# Patient Record
Sex: Female | Born: 1975 | Race: Black or African American | Hispanic: No | Smoking: Never smoker
Health system: Southern US, Community
[De-identification: ages and names within clinical notes are randomized; demographics above are authoritative.]

## PROBLEM LIST (undated history)

## (undated) DIAGNOSIS — E119 Type 2 diabetes mellitus without complications: Secondary | ICD-10-CM

---

## 2017-03-12 ENCOUNTER — Emergency Department (HOSPITAL_COMMUNITY)
Admission: EM | Admit: 2017-03-12 | Discharge: 2017-03-12 | Disposition: A | Discharge status: Home / Self Care | Payer: Medicaid Other | Attending: Emergency Medicine | Admitting: Emergency Medicine

## 2017-03-12 ENCOUNTER — Encounter (HOSPITAL_COMMUNITY): Payer: Self-pay | Admitting: Emergency Medicine

## 2017-03-12 DIAGNOSIS — M549 Dorsalgia, unspecified: Secondary | ICD-10-CM | POA: Diagnosis not present

## 2017-03-12 DIAGNOSIS — G8929 Other chronic pain: Secondary | ICD-10-CM | POA: Insufficient documentation

## 2017-03-12 DIAGNOSIS — M545 Low back pain: Secondary | ICD-10-CM | POA: Diagnosis present

## 2017-03-12 LAB — COMPREHENSIVE METABOLIC PANEL
ALT: 48 U/L (ref 14–54)
AST: 34 U/L (ref 15–41)
Albumin: 4 g/dL (ref 3.5–5.0)
Alkaline Phosphatase: 56 U/L (ref 38–126)
Anion gap: 8 (ref 5–15)
BUN: 7 mg/dL (ref 6–20)
CHLORIDE: 102 mmol/L (ref 101–111)
CO2: 25 mmol/L (ref 22–32)
Calcium: 8.9 mg/dL (ref 8.9–10.3)
Creatinine, Ser: 0.61 mg/dL (ref 0.44–1.00)
Glucose, Bld: 105 mg/dL — ABNORMAL HIGH (ref 65–99)
POTASSIUM: 3.6 mmol/L (ref 3.5–5.1)
SODIUM: 135 mmol/L (ref 135–145)
Total Bilirubin: 0.6 mg/dL (ref 0.3–1.2)
Total Protein: 7.9 g/dL (ref 6.5–8.1)

## 2017-03-12 LAB — URINALYSIS, ROUTINE W REFLEX MICROSCOPIC
Bilirubin Urine: NEGATIVE
Glucose, UA: NEGATIVE mg/dL
Hgb urine dipstick: NEGATIVE
KETONES UR: NEGATIVE mg/dL
LEUKOCYTES UA: NEGATIVE
Nitrite: NEGATIVE
PROTEIN: 30 mg/dL — AB
Specific Gravity, Urine: 1.011 (ref 1.005–1.030)
pH: 7 (ref 5.0–8.0)

## 2017-03-12 LAB — CBC
HEMATOCRIT: 38.5 % (ref 36.0–46.0)
Hemoglobin: 12.8 g/dL (ref 12.0–15.0)
MCH: 28 pg (ref 26.0–34.0)
MCHC: 33.2 g/dL (ref 30.0–36.0)
MCV: 84.2 fL (ref 78.0–100.0)
Platelets: 257 10*3/uL (ref 150–400)
RBC: 4.57 MIL/uL (ref 3.87–5.11)
RDW: 13.5 % (ref 11.5–15.5)
WBC: 5.7 10*3/uL (ref 4.0–10.5)

## 2017-03-12 LAB — LIPASE, BLOOD: LIPASE: 31 U/L (ref 11–51)

## 2017-03-12 MED ORDER — CYCLOBENZAPRINE HCL 10 MG PO TABS: 10.0000 mg | ORAL_TABLET | Freq: Two times a day (BID) | ORAL | 0 refills | Status: DC | PRN

## 2017-03-12 MED ORDER — PREDNISONE 20 MG PO TABS: ORAL_TABLET | ORAL | 0 refills | Status: DC

## 2017-03-12 MED ORDER — IBUPROFEN 600 MG PO TABS: 600.0000 mg | ORAL_TABLET | Freq: Four times a day (QID) | ORAL | 0 refills | Status: DC | PRN

## 2017-03-12 NOTE — ED Notes (Signed)
PA at bedside.

## 2017-03-12 NOTE — ED Provider Notes (Signed)
[facility] DEPT Provider Note   CSN: [id number] Arrival date & time: 03/12/17  1519     History   Chief Complaint Chief Complaint  Patient presents with  . Abdominal Pain  . Back Pain    HPI Raven Porter is a 41 y.o. female.  HPI   41 year old female without any significant past medical history presenting for evaluation of back pain. Patient mentioned that she has had recurrent lower back pain ongoing for many years she has been seen by multiple specialists for her back pain and states nothing was wrong. She recently moved from [place] down here for the past several months. She still continues to having the same back pain and decided to come here for further evaluation. Pain has become aggressively worse the past 3 or 4 days. Pain is described as a pressure and sharp sensation that wraps around her abdomen sometimes radiates down her leg. Pain worse with ambulation and with bending forward. She works for housekeeping. She denies any associated fever, chills, URI symptoms, chest pain, trouble breathing,  vomiting diarrhea, dysuria, or hematuria. Denies any bowel bladder incontinence or saddle anesthesia. She has been taking gabapentin at home without adequate relief. She denies any recent strenuous activities or heavy lifting or any recent injury.  History reviewed. No pertinent past medical history.  There are no active problems to display for this patient.   History reviewed. No pertinent surgical history.  OB History    No data available       Home Medications    Prior to Admission medications   Not on File    Family History No family history on file.  Social History Social History  Substance Use Topics  . Smoking status: Never Smoker  . Smokeless tobacco: Never Used  . Alcohol use No     Allergies   Patient has no allergy information on record.   Review of Systems Review of Systems  All other systems reviewed and are negative.    Physical  Exam Updated Vital Signs BP (!) 139/103 (BP Location: Right Arm)   Pulse (!) 55   Temp 98.8 F (37.1 C) (Oral)   Resp 17   LMP  (LMP Unknown)   SpO2 96%   Physical Exam  Constitutional: She appears well-developed and well-nourished. No distress.  Moderately obese, ambulate without difficulty, in no acute discomfort.  HENT:  Head: Atraumatic.  Eyes: Conjunctivae are normal.  Neck: Neck supple.  Cardiovascular: Normal rate, regular rhythm and intact distal pulses.   Pulmonary/Chest: Effort normal and breath sounds normal.  Abdominal: Soft. Bowel sounds are normal. She exhibits no distension. There is no tenderness.  Musculoskeletal: She exhibits tenderness (Tenderness to lumbar spine and paraspinal muscle on palpation without crepitus or step-off. Normal range of motion throughout lower back. Negative straight leg raise.).  Neurological: She is alert.  5 out 5 strength to bilateral lower extremities with intact distal pulses. Patellar deep tendon reflex intact bilaterally.  Skin: No rash noted.  Psychiatric: She has a normal mood and affect.  Nursing note and vitals reviewed.    ED Treatments / Results  Labs (all labs ordered are listed, but only abnormal results are displayed) Labs Reviewed  COMPREHENSIVE METABOLIC PANEL - Abnormal; Notable for the following:       Result Value   Glucose, Bld 105 (*)    All other components within normal limits  URINALYSIS, ROUTINE W REFLEX MICROSCOPIC - Abnormal; Notable for the following:    Protein, ur 30 (*)  Bacteria, UA RARE (*)    Squamous Epithelial / LPF 0-5 (*)    All other components within normal limits  LIPASE, BLOOD  CBC    EKG  EKG Interpretation None       Radiology No results found.  Procedures Procedures (including critical care time)  Medications Ordered in ED Medications - No data to display   Initial Impression / Assessment and Plan / ED Course  I have reviewed the triage vital signs and the  nursing notes.  Pertinent labs & imaging results that were available during my care of the patient were reviewed by me and considered in my medical decision making (see chart for details).    BP (!) 139/103 (BP Location: Right Arm)   Pulse (!) 55   Temp 98.8 F (37.1 C) (Oral)   Resp 17   LMP  (LMP Unknown)   SpO2 96%    Final Clinical Impressions(s) / ED Diagnoses   Final diagnoses:  Other chronic back pain    New Prescriptions New Prescriptions   CYCLOBENZAPRINE (FLEXERIL) 10 MG TABLET    Take 1 tablet (10 mg total) by mouth 2 (two) times daily as needed for muscle spasms.   IBUPROFEN (ADVIL,MOTRIN) 600 MG TABLET    Take 1 tablet (600 mg total) by mouth every 6 (six) hours as needed.   PREDNISONE (DELTASONE) 20 MG TABLET    2 tabs po daily x 4 days   7:43 PM Patient here with acute on chronic low back pain. Report occasional radicular pain. No red flags such as no history of active cancer or IV drug use. She is able to ambulate without difficulty. She does not have any reproducible abdominal pain on exam. Today her labs are reassuring, normal lipase, normal electrolytes panel, normal WBC, no anemia, urine shows no signs of urinary tract infection. No blood in her urine to suggest kidney stone. No cauda equina symptoms.  Given her moderate body habitus I suspect pain is likely muscular skeletal, possibly sciatica.  Given the chronicity of her sxs, pt d/c home with sxs treatment and outpt ortho referral as needed.  Return precaution given.    Domenic Moras, PA-C 03/12/17 2023    Charlesetta Shanks, MD 03/12/17 2322

## 2017-03-12 NOTE — ED Triage Notes (Signed)
Pt to ER for evaluation of lower back pain with abdominal pain x3-4 days with constant nausea without vomiting or diarrhea. Denies urinary symptoms. A/o x4.

## 2017-03-12 NOTE — ED Notes (Signed)
Patient able to ambulate independently  

## 2018-01-19 ENCOUNTER — Encounter (HOSPITAL_COMMUNITY): Payer: Self-pay | Admitting: *Deleted

## 2018-01-19 ENCOUNTER — Emergency Department (HOSPITAL_COMMUNITY)
Admission: EM | Admit: 2018-01-19 | Discharge: 2018-01-19 | Disposition: A | Discharge status: Home / Self Care | Payer: Medicaid Other | Attending: Emergency Medicine | Admitting: Emergency Medicine

## 2018-01-19 DIAGNOSIS — Z79899 Other long term (current) drug therapy: Secondary | ICD-10-CM | POA: Diagnosis not present

## 2018-01-19 DIAGNOSIS — R1084 Generalized abdominal pain: Secondary | ICD-10-CM | POA: Insufficient documentation

## 2018-01-19 DIAGNOSIS — R110 Nausea: Secondary | ICD-10-CM | POA: Diagnosis not present

## 2018-01-19 LAB — COMPREHENSIVE METABOLIC PANEL
ALBUMIN: 3.5 g/dL (ref 3.5–5.0)
ALK PHOS: 42 U/L (ref 38–126)
ALT: 20 U/L (ref 0–44)
AST: 26 U/L (ref 15–41)
Anion gap: 10 (ref 5–15)
BILIRUBIN TOTAL: 0.7 mg/dL (ref 0.3–1.2)
BUN: 11 mg/dL (ref 6–20)
CALCIUM: 8.7 mg/dL — AB (ref 8.9–10.3)
CO2: 25 mmol/L (ref 22–32)
Chloride: 102 mmol/L (ref 98–111)
Creatinine, Ser: 0.67 mg/dL (ref 0.44–1.00)
GFR calc Af Amer: >60 mL/min (ref >60)
GFR calc non Af Amer: >60 mL/min (ref >60)
GLUCOSE: 177 mg/dL — AB (ref 70–99)
Potassium: 3.6 mmol/L (ref 3.5–5.1)
Sodium: 137 mmol/L (ref 135–145)
TOTAL PROTEIN: 6.7 g/dL (ref 6.5–8.1)

## 2018-01-19 LAB — I-STAT BETA HCG BLOOD, ED (MC, WL, AP ONLY): I-stat hCG, quantitative: <5 m[IU]/mL (ref <5)

## 2018-01-19 LAB — URINALYSIS, ROUTINE W REFLEX MICROSCOPIC
BILIRUBIN URINE: NEGATIVE
GLUCOSE, UA: NEGATIVE mg/dL
Hgb urine dipstick: NEGATIVE
Ketones, ur: NEGATIVE mg/dL
NITRITE: NEGATIVE
Protein, ur: NEGATIVE mg/dL
SPECIFIC GRAVITY, URINE: 1.012 (ref 1.005–1.030)
pH: 6 (ref 5.0–8.0)

## 2018-01-19 LAB — CBC
HCT: 39.8 % (ref 36.0–46.0)
Hemoglobin: 13.4 g/dL (ref 12.0–15.0)
MCH: 29.3 pg (ref 26.0–34.0)
MCHC: 33.7 g/dL (ref 30.0–36.0)
MCV: 87.1 fL (ref 78.0–100.0)
Platelets: 280 10*3/uL (ref 150–400)
RBC: 4.57 MIL/uL (ref 3.87–5.11)
RDW: 11.9 % (ref 11.5–15.5)
WBC: 4.9 10*3/uL (ref 4.0–10.5)

## 2018-01-19 LAB — LIPASE, BLOOD: Lipase: 36 U/L (ref 11–51)

## 2018-01-19 MED ORDER — SODIUM CHLORIDE 0.9 % IV BOLUS
1000.0000 mL | Freq: Once | INTRAVENOUS | Status: AC
  Administered 2018-01-19: 1000 mL via INTRAVENOUS

## 2018-01-19 MED ORDER — ONDANSETRON 4 MG PO TBDP: 4.0000 mg | ORAL_TABLET | Freq: Three times a day (TID) | ORAL | 0 refills | Status: DC | PRN

## 2018-01-19 MED ORDER — DICYCLOMINE HCL 10 MG PO CAPS: 10.0000 mg | ORAL_CAPSULE | Freq: Three times a day (TID) | ORAL | 0 refills | Status: DC

## 2018-01-19 NOTE — Discharge Instructions (Signed)
Please be sure to follow-up with your primary care provider regarding your visit today.  If you do not have a primary care provider please use the resources below to establish one. Please return to emergency department for any new or worsened or if your symptoms do not improve. Please be sure to continue to drink plenty of fluids, water to avoid dehydration. You may use the antinausea medication Zofran as prescribed. You may use the medication Bentyl as prescribed for abdominal pain.  Please do not drive or operate heavy machinery while taking this medication. Your urine has been sent for culture, if it grows bacteria that require antibiotic treatment you will be contacted by [facility].  If you develop urinary symptoms such as burning when you pee or increased urinary frequency please return to the emergency department for treatment. You have refused pelvic examination today, if you develop vaginal symptoms including discharge, pain, bleeding please return the emergency department for further evaluation.  Contact a health care provider if: Your abdominal pain changes or gets worse. You are not hungry or you lose weight without trying. You are constipated or have diarrhea for more than 2-3 days. You have pain when you urinate or have a bowel movement. Your abdominal pain wakes you up at night. Your pain gets worse with meals, after eating, or with certain foods. You are throwing up and cannot keep anything down. You have a fever. Get help right away if: Your pain does not go away as soon as your health care provider told you to expect. You cannot stop throwing up. Your pain is only in areas of the abdomen, such as the right side or the left lower portion of the abdomen. You have bloody or black stools, or stools that look like tar. You have severe pain, cramping, or bloating in your abdomen. You have signs of dehydration, such as: Dark urine, very little urine, or no urine. Cracked lips. Dry  mouth. Sunken eyes. Sleepiness. Weakness.  RESOURCE GUIDE  Chronic Pain Problems: Contact [facility]  [phone] Patients need to be referred by their primary care doctor.  Insufficient Money for Medicine: Contact [employer]:  call "211" or [place] [phone].  No Primary Care Doctor: Call [facility]  [phone] - can help you locate a primary care doctor that  accepts your insurance, provides certain services, etc. Physician Referral [phone]  Agencies that provide inexpensive medical care: [facility]  [facility]  [phone] [facility]  [phone] [facility]  [phone] Planned [facility]  [phone] [facility]  [phone]  [place] Providers: [facility]- [address], [address]  [phone], Mon-Fri 9am-7pm, Sat 9am-1pm [place], [address] [place]  [place], [address] [place]  [place]- [address]  [place], [address], [phone]  Only accepts [place] Access [place] patients after they have their name  applied to their card  Self Pay (no insurance) in [facility]: Sickle Cell Patients: Dr Kevan Ny, [facility]  [place], [facility]- [place]  [facility] [place]- [place], [place]  [facility]- see information above (Speak to [place] if you do not have insurance)       -  [facility]- [place], [place],  [place], [place]  Dr Vista Lawman-  [address], [address], [place], [place]  Urgent Care- [address], [phone]       -  [facility] [place]- [address], [place], also [address], [phone]       -    [facility]- [address], [place], 1st & 3rd Saturday   every month, 10am-1pm  1) Find a Doctor and Pay Out of Pocket Although you won't have to find out who is covered by your insurance plan, it is a good idea to ask around and get recommendations. You will then need to call the office and see if the doctor you have chosen will accept you as a new patient and what types of options they offer for patients who are self-pay. Some doctors offer discounts or will set up payment plans for their patients who do not have insurance, but you will need to ask so you aren't surprised when you get to your appointment.  2) Contact Your Local Health Department Not all health departments have doctors that can see patients for sick visits, but many do, so it is worth a call to see if yours does. If you don't know where your local health department is, you can check in your phone book. The CDC also has a tool to help you locate your state's health department, and many state websites also have listings of all of their local health departments.  3) Find a [facility] If your illness is not likely to be very severe or complicated, you may want to try a walk in clinic. These are popping up all over the country in pharmacies, drugstores, and shopping centers. They're usually staffed by nurse practitioners or physician assistants that have been trained to treat common illnesses and complaints. They're usually fairly quick and inexpensive. However, if you have serious medical issues or chronic medical problems, these are probably not your best option  [facility] [place], [facility], [address] [place], phone [phone] or [phone].  Monday - Friday, call for an appointment. [place], [facility], [address], [place], phone [phone] or [phone].  Monday - Friday, call for an appointment.  Abuse/Neglect: [place] [phone] [place] [phone] (After Hours)  Emergency Shelter:  [facility] [phone]  Maternity Homes: Room at the [place] [phone] [place] [phone]  MRSA Hotline #:   802 728 8410  [facility] Dept. [address]         [place]  Sela Hua Phone:  [phone]                                  Phone:  [phone]                   Phone:  [place], [place]- [phone]       -     [facility] in [place], [address],                                  [phone], [place] [phone] or [phone] (After Hours)   [place]  Substance Abuse Resources: Alcohol and Drug Services  [phone] [place] [phone] [facility] Chinita Pester [phone] Residential & Outpatient Substance Abuse Program  6121867913  Psychological Services: [place]  [phone] [place]  [place], [place]. [address] [place], [place]: [phone] or [phone], [employer]uy  Dental Assistance  If unable to pay or uninsured, contact:  [facility] or [facility]. to become qualified for the adult dental clinic.  Patients with  Medicaid: [facility] [phone] [address], [place] [address] [phone]  If unable to pay, or uninsured, contact [facility] [phone]) or [place] [phone] in [place], [place] in [facility]) to become qualified for the adult dental clinic   Other [place]- [place], [place], [place], [address], [place], [place], 2nd and 4th Thursday of the month at 6:30am.  10 clients each day by appointment, can sometimes see walk-in patients if someone does not show for an appointment. [facility]- [address] Hillard Danker [place], [place], [address], [place], [place], [place], [address], [place] Department- [phone] [place] [facility] [phone]

## 2018-01-19 NOTE — ED Triage Notes (Signed)
Pt in c/o generalized abdominal pain and lower back pain for the last month with n/v intermittently, denies changes in bowel patterns, also frequent urination

## 2018-01-19 NOTE — ED Provider Notes (Addendum)
[facility] EMERGENCY DEPARTMENT Provider Note   CSN: [id number] Arrival date & time: 01/19/18  1435     History   Chief Complaint Chief Complaint  Patient presents with  . Abdominal Pain    HPI Raven Porter is a 42 y.o. female presenting for 1 month of generalized abdominal pain.  Patient describes the pain as a sharp pain throughout her entire abdomen that is worse with palpation, movement and eating; however not every time she eats.  Patient states that the pain has constant for the past month however varies in severity, at this time she describes her pain as 5/10 in severity.  She states that she has intermittent nausea however denies vomiting, diarrhea or change in stool color.  Patient states that she has eaten watermelon and rice today and yesterday without nausea or vomiting, patient states that her last bowel movement was this morning and was normal.  Patient states that she has not taken anything for her pain. Patient states that pain will occasionally radiate and feels that is having her lower back, she states that she is not having back pain at this time.  Patient denies injury or trauma.  Patient denies radiation of pain to her legs.  Patient denies urinary or fecal incontinence.  Patient denies fever, vomiting, diarrhea, change in appetite, trauma/injury, change in stool color, hematemesis, dysuria, hematuria.  HPI  History reviewed. No pertinent past medical history.  There are no active problems to display for this patient.   History reviewed. No pertinent surgical history.   OB History   None      Home Medications    Prior to Admission medications   Medication Sig Start Date End Date Taking? Authorizing Provider  cyclobenzaprine (FLEXERIL) 10 MG tablet Take 1 tablet (10 mg total) by mouth 2 (two) times daily as needed for muscle spasms. 03/12/17   Domenic Moras, PA-C  dicyclomine (BENTYL) 10 MG capsule Take 1 capsule (10 mg total) by mouth 3  (three) times daily before meals. 01/19/18   Nuala Alpha A, PA-C  ibuprofen (ADVIL,MOTRIN) 600 MG tablet Take 1 tablet (600 mg total) by mouth every 6 (six) hours as needed. 03/12/17   Domenic Moras, PA-C  ondansetron (ZOFRAN ODT) 4 MG disintegrating tablet Take 1 tablet (4 mg total) by mouth every 8 (eight) hours as needed for nausea or vomiting. 01/19/18   Deliah Boston, PA-C  predniSONE (DELTASONE) 20 MG tablet 2 tabs po daily x 4 days 03/12/17   Domenic Moras, PA-C    Family History History reviewed. No pertinent family history.  Social History Social History   Tobacco Use  . Smoking status: Never Smoker  . Smokeless tobacco: Never Used  Substance Use Topics  . Alcohol use: No  . Drug use: Not on file     Allergies   Patient has no known allergies.   Review of Systems Review of Systems  Constitutional: Negative.  Negative for chills, fatigue and fever.  HENT: Negative.  Negative for rhinorrhea and sore throat.   Eyes: Negative.  Negative for visual disturbance.  Respiratory: Negative.  Negative for cough and shortness of breath.   Cardiovascular: Negative.  Negative for chest pain.  Gastrointestinal: Positive for abdominal pain and nausea. Negative for blood in stool, diarrhea and vomiting.  Genitourinary: Negative.  Negative for difficulty urinating, dysuria, frequency, hematuria, pelvic pain, urgency, vaginal bleeding and vaginal discharge.  Musculoskeletal: Negative.  Negative for arthralgias and myalgias.  Skin: Negative.  Negative for rash.  Neurological: Negative.  Negative for dizziness, weakness and headaches.     Physical Exam Updated Vital Signs BP 117/80 (BP Location: Left Arm)   Pulse 61   Temp 98.2 F (36.8 C) (Oral)   Resp 18   SpO2 100%   Physical Exam  Constitutional: She is oriented to person, place, and time. She appears well-developed and well-nourished. She does not appear ill. No distress.  HENT:  Head: Normocephalic and atraumatic.    Right Ear: External ear normal.  Left Ear: External ear normal.  Nose: Nose normal.  Mouth/Throat: Oropharynx is clear and moist.  Eyes: Pupils are equal, round, and reactive to light. EOM are normal.  Neck: Trachea normal and normal range of motion. No tracheal deviation present.  Cardiovascular: Normal rate, regular rhythm, normal heart sounds and intact distal pulses.  Pulmonary/Chest: Effort normal and breath sounds normal. No respiratory distress.  Abdominal: Soft. Normal appearance and bowel sounds are normal. There is generalized tenderness. There is no rigidity, no rebound, no guarding, no CVA tenderness, no tenderness at McBurney's point and negative Murphy's sign.  Genitourinary:  Genitourinary Comments: Patient Refused Pelvic Exam  Musculoskeletal: Normal range of motion.       Thoracic back: Normal. She exhibits normal range of motion, no tenderness, no bony tenderness, no swelling, no edema and no deformity.       Lumbar back: Normal. She exhibits normal range of motion, no tenderness, no bony tenderness, no swelling, no edema and no deformity.  No midline spinal tenderness to palpation, no paraspinal muscle tenderness, no deformity, crepitus, or step-off noted  Neurological: She is alert and oriented to person, place, and time.  Skin: Skin is warm and dry. Capillary refill takes less than 2 seconds.  Psychiatric: She has a normal mood and affect. Her behavior is normal.     ED Treatments / Results  Labs (all labs ordered are listed, but only abnormal results are displayed) Labs Reviewed  COMPREHENSIVE METABOLIC PANEL - Abnormal; Notable for the following components:      Result Value   Glucose, Bld 177 (*)    Calcium 8.7 (*)    All other components within normal limits  URINALYSIS, ROUTINE W REFLEX MICROSCOPIC - Abnormal; Notable for the following components:   Leukocytes, UA TRACE (*)    Bacteria, UA RARE (*)    All other components within normal limits  URINE  CULTURE  LIPASE, BLOOD  CBC  I-STAT BETA HCG BLOOD, ED ([facility], [facility], [facility] ONLY)    EKG None  Radiology No results found.  Procedures Procedures (including critical care time)  Medications Ordered in ED Medications  sodium chloride 0.9 % bolus 1,000 mL (1,000 mLs Intravenous New Bag/Given 01/19/18 1759)     Initial Impression / Assessment and Plan / ED Course  I have reviewed the triage vital signs and the nursing notes.  Pertinent labs & imaging results that were available during my care of the patient were reviewed by me and considered in my medical decision making (see chart for details).     42 year old female presenting with 1 month of generalized abdominal pain. Patient is afebrile, non-toxic appearing, sitting comfortably on examination table. Patient's pain and other symptoms adequately managed in emergency department. Fluid bolus given. Labs and vitals reviewed.  Patient does not meet the SIRS or Sepsis criteria.  On repeat exam patient does not have a surgical abdomen and there are no peritoneal signs.  No indication of appendicitis, bowel obstruction, bowel perforation, cholecystitis, diverticulitis, or  ectopic pregnancy.  Patient refused pelvic examination today, she is denying vaginal bleeding or discharge, I have informed the patient that if she develops vaginal symptoms that she may return to the emerge department at anytime for a pelvic examination.  Patient is tolerating p.o. liquids in department without difficulty.  Patient's urinalysis sent for culture.  Patient is asymptomatic, I do not believe antibiotics for a UTI is indicated at this time, pending culture results patient will be contacted by [facility]. Patient is without CVA tenderness I doubt pyelonephritis at this time.  I have prescribed the patient ODT Zofran as well as Bentyl. Patient states that her last bowel movement was here in department and was normal.  I have spoken to the patient at length about work-up today and  treatment, I have informed her that her if her symptoms do not improve that she should return to the emergency department for further evaluation.  Patient states that she does have insurance I have encouraged her to follow-up with a primary care provider for further evaluation regarding her symptoms today.  At this time there does not appear to be any evidence of an acute emergency medical condition and the patient appears stable for discharge with appropriate outpatient follow up. Diagnosis was discussed with patient who verbalizes understanding of care plan and is agreeable to discharge. I have discussed return precautions with patient who verbalizes understanding of return precautions. Patient strongly encouraged to follow-up with their PCP. All questions answered.  Patient's case discussed with and labs reviewed by Dr. Sherry Ruffing who agrees with plan to discharge with follow-up.     Note: Portions of this report may have been transcribed using voice recognition software. Every effort was made to ensure accuracy; however, inadvertent computerized transcription errors may still be present.     Final Clinical Impressions(s) / ED Diagnoses   Final diagnoses:  Generalized abdominal pain    ED Discharge Orders         Ordered    ondansetron (ZOFRAN ODT) 4 MG disintegrating tablet  Every 8 hours PRN     01/19/18 1842    dicyclomine (BENTYL) 10 MG capsule  3 times daily before meals     01/19/18 1842           Deliah Boston, PA-C 01/19/18 1908    Gari Crown 01/19/18 1913    Tegeler, Gwenyth Allegra, MD 01/19/18 [phone]

## 2018-01-19 NOTE — ED Notes (Signed)
See PA note for assessment

## 2018-01-20 LAB — URINE CULTURE

## 2018-01-22 ENCOUNTER — Emergency Department (HOSPITAL_COMMUNITY)
Admission: EM | Admit: 2018-01-22 | Discharge: 2018-01-22 | Disposition: A | Discharge status: Home / Self Care | Payer: Medicaid Other | Attending: Emergency Medicine | Admitting: Emergency Medicine

## 2018-01-22 ENCOUNTER — Emergency Department (HOSPITAL_COMMUNITY): Payer: Medicaid Other

## 2018-01-22 ENCOUNTER — Encounter (HOSPITAL_COMMUNITY): Payer: Self-pay

## 2018-01-22 DIAGNOSIS — R1084 Generalized abdominal pain: Secondary | ICD-10-CM | POA: Insufficient documentation

## 2018-01-22 DIAGNOSIS — R112 Nausea with vomiting, unspecified: Secondary | ICD-10-CM

## 2018-01-22 DIAGNOSIS — R109 Unspecified abdominal pain: Secondary | ICD-10-CM | POA: Diagnosis present

## 2018-01-22 LAB — URINALYSIS, ROUTINE W REFLEX MICROSCOPIC
BILIRUBIN URINE: NEGATIVE
Glucose, UA: NEGATIVE mg/dL
KETONES UR: NEGATIVE mg/dL
NITRITE: NEGATIVE
Protein, ur: NEGATIVE mg/dL
SPECIFIC GRAVITY, URINE: 1.008 (ref 1.005–1.030)
pH: 6 (ref 5.0–8.0)

## 2018-01-22 LAB — I-STAT BETA HCG BLOOD, ED (MC, WL, AP ONLY)

## 2018-01-22 LAB — COMPREHENSIVE METABOLIC PANEL
ALK PHOS: 52 U/L (ref 38–126)
ALT: 26 U/L (ref 0–44)
ANION GAP: 8 (ref 5–15)
AST: 24 U/L (ref 15–41)
Albumin: 3.6 g/dL (ref 3.5–5.0)
BILIRUBIN TOTAL: 0.7 mg/dL (ref 0.3–1.2)
BUN: 10 mg/dL (ref 6–20)
CALCIUM: 8.9 mg/dL (ref 8.9–10.3)
CO2: 25 mmol/L (ref 22–32)
Chloride: 103 mmol/L (ref 98–111)
Creatinine, Ser: 0.56 mg/dL (ref 0.44–1.00)
GFR calc Af Amer: >60 mL/min (ref >60)
Glucose, Bld: 155 mg/dL — ABNORMAL HIGH (ref 70–99)
POTASSIUM: 3.9 mmol/L (ref 3.5–5.1)
Sodium: 136 mmol/L (ref 135–145)
TOTAL PROTEIN: 6.9 g/dL (ref 6.5–8.1)

## 2018-01-22 LAB — CBC
HEMATOCRIT: 38.7 % (ref 36.0–46.0)
HEMOGLOBIN: 13.3 g/dL (ref 12.0–15.0)
MCH: 29.9 pg (ref 26.0–34.0)
MCHC: 34.4 g/dL (ref 30.0–36.0)
MCV: 87 fL (ref 78.0–100.0)
Platelets: 279 10*3/uL (ref 150–400)
RBC: 4.45 MIL/uL (ref 3.87–5.11)
RDW: 11.9 % (ref 11.5–15.5)
WBC: 5.4 10*3/uL (ref 4.0–10.5)

## 2018-01-22 LAB — PARASITE EXAM SCREEN, BLOOD-W CONF TO LABCORP (NOT @ ARMC)

## 2018-01-22 LAB — LIPASE, BLOOD: Lipase: 41 U/L (ref 11–51)

## 2018-01-22 MED ORDER — PANTOPRAZOLE SODIUM 20 MG PO TBEC: 20.0000 mg | DELAYED_RELEASE_TABLET | Freq: Every day | ORAL | 0 refills | Status: DC

## 2018-01-22 MED ORDER — IOHEXOL 300 MG/ML  SOLN
100.0000 mL | Freq: Once | INTRAMUSCULAR | Status: AC | PRN
  Administered 2018-01-22: 100 mL via INTRAVENOUS

## 2018-01-22 MED ORDER — PROMETHAZINE HCL 25 MG PO TABS: 25.0000 mg | ORAL_TABLET | Freq: Four times a day (QID) | ORAL | 0 refills | Status: DC | PRN

## 2018-01-22 MED ORDER — SODIUM CHLORIDE 0.9 % IV BOLUS
1000.0000 mL | Freq: Once | INTRAVENOUS | Status: AC
  Administered 2018-01-22: 1000 mL via INTRAVENOUS

## 2018-01-22 MED ORDER — PROMETHAZINE HCL 25 MG/ML IJ SOLN
12.5000 mg | Freq: Once | INTRAMUSCULAR | Status: AC
  Administered 2018-01-22: 12.5 mg via INTRAVENOUS
  Filled 2018-01-22: qty 1

## 2018-01-22 MED ORDER — SUCRALFATE 1 G PO TABS: 1.0000 g | ORAL_TABLET | Freq: Three times a day (TID) | ORAL | 0 refills | Status: DC

## 2018-01-22 NOTE — ED Notes (Signed)
Pt alert and oriented in NAD. Pt verbalized understanding of discharge instructions. 

## 2018-01-22 NOTE — Discharge Instructions (Signed)

## 2018-01-22 NOTE — ED Notes (Signed)
Pt remains at CT

## 2018-01-22 NOTE — ED Triage Notes (Signed)
Pt presents for evaluation of ongoing nausea and abd pain. Was seen here on 8/13 for same. Patient reports central abd pain with radiation to back. States has nausea persistently that is worse after eating. Reports taking zofran and bentyl with no improvement.

## 2018-01-22 NOTE — ED Notes (Signed)
Reported soft bp to Dr. Laverta [place].

## 2018-01-22 NOTE — ED Notes (Signed)
Lab called to report blood smear clean for parasites, but will send out for final review. Dr Laverta Baltimore notified.

## 2018-01-22 NOTE — ED Notes (Signed)
Patient transported to X-ray 

## 2018-01-22 NOTE — ED Provider Notes (Signed)
Emergency Department Provider Note   I have reviewed the triage vital signs and the nursing notes.   HISTORY  Chief Complaint Abdominal Pain   HPI Raven Porter is a 42 y.o. female presents to the emergency department for evaluation of continued abdominal discomfort with nausea vomiting.  She has had symptoms for the past month.  Patient tells me that she has had other fevers which seem to come every night Suhail Peloquin with sweating.  She denies any respiratory symptoms such as cough or hemoptysis.  She is originally from [place] but has not traveled there in the last year.  Significant body aches, vomiting, diarrhea.  No blood in the emesis.   History reviewed. No pertinent past medical history.  There are no active problems to display for this patient.   History reviewed. No pertinent surgical history.  Allergies Patient has no known allergies.  No family history on file.  Social History Social History   Tobacco Use  . Smoking status: Never Smoker  . Smokeless tobacco: Never Used  Substance Use Topics  . Alcohol use: No  . Drug use: Not on file    Review of Systems  Constitutional: Positive intermittent fever.  Eyes: No visual changes. ENT: No sore throat. Cardiovascular: Denies chest pain. Respiratory: Denies shortness of breath. Gastrointestinal: Positive abdominal pain. Positive nausea and vomiting.  No diarrhea.  No constipation. Genitourinary: Negative for dysuria. Musculoskeletal: Negative for back pain. Skin: Negative for rash. Neurological: Negative for headaches, focal weakness or numbness.  10-point ROS otherwise negative.  ____________________________________________   PHYSICAL EXAM:  VITAL SIGNS: ED Triage Vitals  Enc Vitals Group     BP 01/22/18 0944 123/83     Pulse Rate 01/22/18 0944 81     Resp 01/22/18 0944 18     Temp 01/22/18 0944 98.6 F (37 C)     Temp Source 01/22/18 0944 Oral     SpO2 01/22/18 0944 100 %   Constitutional:  Alert and oriented. Well appearing and in no acute distress. Eyes: Conjunctivae are normal.  Head: Atraumatic. Nose: No congestion/rhinnorhea. Mouth/Throat: Mucous membranes are moist.  Oropharynx non-erythematous. Neck: No stridor.   Cardiovascular: Normal rate, regular rhythm. Good peripheral circulation. Grossly normal heart sounds.   Respiratory: Normal respiratory effort.  No retractions. Lungs CTAB. Gastrointestinal: Soft and nontender. No distention.  Musculoskeletal: No lower extremity tenderness nor edema. No gross deformities of extremities. Neurologic:  Normal speech and language. No gross focal neurologic deficits are appreciated.  Skin:  Skin is warm, dry and intact. No rash noted.  ____________________________________________   LABS (all labs ordered are listed, but only abnormal results are displayed)  Labs Reviewed  COMPREHENSIVE METABOLIC PANEL - Abnormal; Notable for the following components:      Result Value   Glucose, Bld 155 (*)    All other components within normal limits  URINALYSIS, ROUTINE W REFLEX MICROSCOPIC - Abnormal; Notable for the following components:   Color, Urine STRAW (*)    Hgb urine dipstick SMALL (*)    Leukocytes, UA SMALL (*)    Bacteria, UA RARE (*)    All other components within normal limits  LIPASE, BLOOD  CBC  PARASITE EXAM SCREEN, BLOOD-W CONF TO [facility] (NOT @ [facility])  PARASITE EXAM, BLOOD  I-STAT BETA HCG BLOOD, ED ([facility], [facility], [facility] ONLY)   ____________________________________________  RADIOLOGY  Dg Chest 2 View  Result Date: 01/22/2018 CLINICAL DATA:  Back pain. EXAM: CHEST - 2 VIEW COMPARISON:  None. FINDINGS: The heart  size and mediastinal contours are within normal limits. Both lungs are clear. No pneumothorax or pleural effusion is noted. The visualized skeletal structures are unremarkable. IMPRESSION: No active cardiopulmonary disease. Electronically Signed   By: Marijo Conception, M.D.   On: 01/22/2018 13:07   Ct Abdomen  Pelvis W Contrast  Result Date: 01/22/2018 CLINICAL DATA:  Abdominal pain nausea radiating to the back EXAM: CT ABDOMEN AND PELVIS WITH CONTRAST TECHNIQUE: Multidetector CT imaging of the abdomen and pelvis was performed using the standard protocol following bolus administration of intravenous contrast. CONTRAST:  162mL OMNIPAQUE IOHEXOL 300 MG/ML  SOLN COMPARISON:  Chest x-ray 01/22/2018 FINDINGS: Lower chest: Lung bases demonstrate no acute consolidation or pleural effusion. Mild cardiomegaly. Hepatobiliary: No focal liver abnormality is seen. No gallstones, gallbladder wall thickening, or biliary dilatation. Pancreas: Unremarkable. No pancreatic ductal dilatation or surrounding inflammatory changes. Spleen: Normal in size without focal abnormality. Adrenals/Urinary Tract: Adrenal glands are unremarkable. Kidneys are normal, without renal calculi, focal lesion, or hydronephrosis. Bladder is unremarkable. Stomach/Bowel: Stomach is within normal limits. Appendix appears normal. No evidence of bowel wall thickening, distention, or inflammatory changes. Vascular/Lymphatic: No significant vascular findings are present. No enlarged abdominal or pelvic lymph nodes. Reproductive: Intrauterine device in the uterus. Minimal hyperdensity in the left adnexa may reflect collapsing cyst or slightly prominent vascular enhancement. Other: Small fat in the umbilical region. No free air or free fluid. Musculoskeletal: No acute or significant osseous findings. IMPRESSION: No CT evidence for acute intra-abdominal or pelvic abnormality. Mild cardiomegaly. Electronically Signed   By: Donavan Foil M.D.   On: 01/22/2018 15:43    ____________________________________________   PROCEDURES  Procedure(s) performed:   Procedures  None ____________________________________________   INITIAL IMPRESSION / ASSESSMENT AND PLAN / ED COURSE  Pertinent labs & imaging results that were available during my care of the patient were  reviewed by me and considered in my medical decision making (see chart for details).  Patient presents to the emergency department for evaluation of abdominal pain with nausea and vomiting.  She also tells me about cyclical subjective fevers at night with sweating.  She last visited [place] 1 year ago.  Low suspicion for malaria but will send a screening smear. Labs unremarkable. Plan for CXR and CT abdomen/pelvis with persistent symptoms.   CXR, CT abdomen reviewed with no acute findings. No parasites on smear. Labs unremarkable. Patient feeling better after meds. Advised f/u with PCP and GI.   At this time, I do not feel there is any life-threatening condition present. I have reviewed and discussed all results (EKG, imaging, lab, urine as appropriate), exam findings with patient. I have reviewed nursing notes and appropriate previous records.  I feel the patient is safe to be discharged home without further emergent workup. Discussed usual and customary return precautions. Patient and family (if present) verbalize understanding and are comfortable with this plan.  Patient will follow-up with their primary care provider. If they do not have a primary care provider, information for follow-up has been provided to them. All questions have been answered.  ____________________________________________  FINAL CLINICAL IMPRESSION(S) / ED DIAGNOSES  Final diagnoses:  Generalized abdominal pain  Non-intractable vomiting with nausea, unspecified vomiting type     MEDICATIONS GIVEN DURING THIS VISIT:  Medications  sodium chloride 0.9 % bolus 1,000 mL (0 mLs Intravenous Stopped 01/22/18 1451)  promethazine (PHENERGAN) injection 12.5 mg (12.5 mg Intravenous Given 01/22/18 1328)  iohexol (OMNIPAQUE) 300 MG/ML solution 100 mL (100 mLs Intravenous Contrast Given 01/22/18  1510)     NEW OUTPATIENT MEDICATIONS STARTED DURING THIS VISIT:  Discharge Medication List as of 01/22/2018  4:12 PM    START  taking these medications   Details  pantoprazole (PROTONIX) 20 MG tablet Take 1 tablet (20 mg total) by mouth daily., Starting Fri 01/22/2018, Until Sun 02/21/2018, Print    promethazine (PHENERGAN) 25 MG tablet Take 1 tablet (25 mg total) by mouth every 6 (six) hours as needed for nausea or vomiting., Starting Fri 01/22/2018, Print    sucralfate (CARAFATE) 1 g tablet Take 1 tablet (1 g total) by mouth 4 (four) times daily -  with meals and at bedtime for 7 days., Starting Fri 01/22/2018, Until Fri 01/29/2018, Print        Note:  This document was prepared using Dragon voice recognition software and may include unintentional dictation errors.  Nanda Quinton, MD Emergency Medicine    Shauntae Reitman, [facility], MD 01/23/18 [phone]

## 2018-01-24 LAB — PARASITE EXAM, BLOOD

## 2018-03-25 ENCOUNTER — Inpatient Hospital Stay (HOSPITAL_COMMUNITY): Payer: Self-pay

## 2018-03-25 ENCOUNTER — Inpatient Hospital Stay (HOSPITAL_COMMUNITY)
Admission: AD | Admit: 2018-03-25 | Discharge: 2018-03-25 | Disposition: A | Discharge status: Home / Self Care | Payer: Self-pay | Source: Ambulatory Visit | Attending: Family Medicine | Admitting: Family Medicine

## 2018-03-25 ENCOUNTER — Encounter (HOSPITAL_COMMUNITY): Payer: Self-pay | Admitting: *Deleted

## 2018-03-25 ENCOUNTER — Other Ambulatory Visit: Payer: Self-pay

## 2018-03-25 DIAGNOSIS — R102 Pelvic and perineal pain: Secondary | ICD-10-CM | POA: Insufficient documentation

## 2018-03-25 DIAGNOSIS — Z3202 Encounter for pregnancy test, result negative: Secondary | ICD-10-CM | POA: Insufficient documentation

## 2018-03-25 DIAGNOSIS — D259 Leiomyoma of uterus, unspecified: Secondary | ICD-10-CM | POA: Insufficient documentation

## 2018-03-25 DIAGNOSIS — Z975 Presence of (intrauterine) contraceptive device: Secondary | ICD-10-CM | POA: Insufficient documentation

## 2018-03-25 DIAGNOSIS — R109 Unspecified abdominal pain: Secondary | ICD-10-CM | POA: Insufficient documentation

## 2018-03-25 HISTORY — DX: Type 2 diabetes mellitus without complications: E11.9

## 2018-03-25 LAB — WET PREP, GENITAL
Clue Cells Wet Prep HPF POC: NONE SEEN
SPERM: NONE SEEN
Trich, Wet Prep: NONE SEEN
Yeast Wet Prep HPF POC: NONE SEEN

## 2018-03-25 LAB — URINALYSIS, ROUTINE W REFLEX MICROSCOPIC
Bilirubin Urine: NEGATIVE
GLUCOSE, UA: NEGATIVE mg/dL
Ketones, ur: NEGATIVE mg/dL
Leukocytes, UA: NEGATIVE
NITRITE: NEGATIVE
PH: 8 (ref 5.0–8.0)
PROTEIN: 30 mg/dL — AB
SPECIFIC GRAVITY, URINE: 1.011 (ref 1.005–1.030)

## 2018-03-25 LAB — COMPREHENSIVE METABOLIC PANEL
ALK PHOS: 45 U/L (ref 38–126)
ALT: 29 U/L (ref 0–44)
AST: 27 U/L (ref 15–41)
Albumin: 3.7 g/dL (ref 3.5–5.0)
Anion gap: 8 (ref 5–15)
BUN: 10 mg/dL (ref 6–20)
CALCIUM: 8.9 mg/dL (ref 8.9–10.3)
CHLORIDE: 106 mmol/L (ref 98–111)
CO2: 25 mmol/L (ref 22–32)
CREATININE: 0.54 mg/dL (ref 0.44–1.00)
Glucose, Bld: 113 mg/dL — ABNORMAL HIGH (ref 70–99)
Potassium: 3.7 mmol/L (ref 3.5–5.1)
Sodium: 139 mmol/L (ref 135–145)
Total Bilirubin: 0.8 mg/dL (ref 0.3–1.2)
Total Protein: 7.6 g/dL (ref 6.5–8.1)

## 2018-03-25 LAB — CBC
HCT: 39.3 % (ref 36.0–46.0)
HEMOGLOBIN: 13.7 g/dL (ref 12.0–15.0)
MCH: 29.7 pg (ref 26.0–34.0)
MCHC: 34.9 g/dL (ref 30.0–36.0)
MCV: 85.1 fL (ref 80.0–100.0)
NRBC: 0 % (ref 0.0–0.2)
PLATELETS: 241 10*3/uL (ref 150–400)
RBC: 4.62 MIL/uL (ref 3.87–5.11)
RDW: 12.5 % (ref 11.5–15.5)
WBC: 4.6 10*3/uL (ref 4.0–10.5)

## 2018-03-25 LAB — POCT PREGNANCY, URINE: Preg Test, Ur: NEGATIVE

## 2018-03-25 LAB — LIPASE, BLOOD: LIPASE: 29 U/L (ref 11–51)

## 2018-03-25 MED ORDER — OXYCODONE-ACETAMINOPHEN 5-325 MG PO TABS
2.0000 | ORAL_TABLET | Freq: Once | ORAL | Status: AC
  Administered 2018-03-25: 2 via ORAL
  Filled 2018-03-25: qty 2

## 2018-03-25 MED ORDER — DICYCLOMINE HCL 10 MG PO CAPS: 10.0000 mg | ORAL_CAPSULE | Freq: Three times a day (TID) | ORAL | 2 refills | Status: DC

## 2018-03-25 MED ORDER — KETOROLAC TROMETHAMINE 60 MG/2ML IM SOLN
60.0000 mg | Freq: Once | INTRAMUSCULAR | Status: AC
  Administered 2018-03-25: 60 mg via INTRAMUSCULAR
  Filled 2018-03-25: qty 2

## 2018-03-25 MED ORDER — NAPROXEN 500 MG PO TABS: 500.0000 mg | ORAL_TABLET | Freq: Two times a day (BID) | ORAL | 0 refills | Status: DC

## 2018-03-25 MED ORDER — OXYCODONE-ACETAMINOPHEN 5-325 MG PO TABS: 1.0000 | ORAL_TABLET | Freq: Four times a day (QID) | ORAL | 0 refills | Status: DC | PRN

## 2018-03-25 NOTE — MAU Note (Signed)
Pt presents to MAU with complaints of lower abdominal cramping that radiates to her back that started a month ago. Pt has IUD but states she may be pregnant because she doesn't know if it feel out. Scant amount of vaginal bleeding

## 2018-03-25 NOTE — MAU Provider Note (Signed)
Chief Complaint: Abdominal Pain   First Provider Initiated Contact with Patient 03/25/18 1047      SUBJECTIVE HPI: Raven Porter is a 42 y.o. [id number] who presents to maternity admissions reporting abdominal pain and bloating  X 2 months.  She reports 2 visits prior to today for her symptoms, [facility]  Both work ups did not find the cause for her pain. The pain is in her mid/low abdomen, bilaterally, is cramping pain and is associated with bloating and enlargement of her stomach. It is not associated with eating, nothing makes it better or worse. She has an IUD x 3 years and was told at the [facility] that her strings were not visible. She is concerned about pregnancy if the IUD has come out.  There are no other symptoms. She has not tried any treatments.  She is having light vaginal spotting currently.   She denies vaginal itching/burning, urinary symptoms, h/a, dizziness, n/v, or fever/chills.     HPI  Past Medical History:  Diagnosis Date  . Diabetes mellitus without complication [facility])    Past Surgical History:  Procedure Laterality Date  . CESAREAN SECTION     Social History   Socioeconomic History  . Marital status: Divorced    Spouse name: Not on file  . Number of children: Not on file  . Years of education: Not on file  . Highest education level: Not on file  Occupational History  . Not on file  Social Needs  . Financial resource strain: Not on file  . Food insecurity:    Worry: Not on file    Inability: Not on file  . Transportation needs:    Medical: Not on file    Non-medical: Not on file  Tobacco Use  . Smoking status: Never Smoker  . Smokeless tobacco: Never Used  Substance and Sexual Activity  . Alcohol use: No  . Drug use: Not on file  . Sexual activity: Yes    Birth control/protection: IUD  Lifestyle  . Physical activity:    Days per week: Not on file    Minutes per session: Not on file  . Stress: Not on file   Relationships  . Social connections:    Talks on phone: Not on file    Gets together: Not on file    Attends religious service: Not on file    Active member of club or organization: Not on file    Attends meetings of clubs or organizations: Not on file    Relationship status: Not on file  . Intimate partner violence:    Fear of current or ex partner: Not on file    Emotionally abused: Not on file    Physically abused: Not on file    Forced sexual activity: Not on file  Other Topics Concern  . Not on file  Social History Narrative  . Not on file   No current facility-administered medications on file prior to encounter.    Current Outpatient Medications on File Prior to Encounter  Medication Sig Dispense Refill  . cyclobenzaprine (FLEXERIL) 10 MG tablet Take 1 tablet (10 mg total) by mouth 2 (two) times daily as needed for muscle spasms. (Patient not taking: Reported on 01/22/2018) 20 tablet 0  . ondansetron (ZOFRAN ODT) 4 MG disintegrating tablet Take 1 tablet (4 mg total) by mouth every 8 (eight) hours as needed for nausea or vomiting. 15 tablet 0  . pantoprazole (PROTONIX) 20  MG tablet Take 1 tablet (20 mg total) by mouth daily. 30 tablet 0  . promethazine (PHENERGAN) 25 MG tablet Take 1 tablet (25 mg total) by mouth every 6 (six) hours as needed for nausea or vomiting. 15 tablet 0  . sucralfate (CARAFATE) 1 g tablet Take 1 tablet (1 g total) by mouth 4 (four) times daily -  with meals and at bedtime for 7 days. 21 tablet 0   No Known Allergies  ROS:  Review of Systems  Constitutional: Negative for chills, fatigue and fever.  Respiratory: Negative for shortness of breath.   Cardiovascular: Negative for chest pain.  Gastrointestinal: Positive for abdominal pain. Negative for nausea and vomiting.  Genitourinary: Positive for vaginal bleeding. Negative for difficulty urinating, dysuria, flank pain, pelvic pain, vaginal discharge and vaginal pain.  Neurological: Negative for  dizziness and headaches.  Psychiatric/Behavioral: Negative.      I have reviewed patient's Past Medical Hx, Surgical Hx, Family Hx, Social Hx, medications and allergies.   Physical Exam   Patient Vitals for the past 24 hrs:  BP Temp Pulse Resp Height Weight  03/25/18 1519 139/75 98.7 F (37.1 C) 66 16 - -  03/25/18 0939 135/85 98.7 F (37.1 C) 75 16 5\' 2"  (1.575 m) 110.2 kg   Constitutional: Well-developed, well-nourished female in no acute distress.  Cardiovascular: normal rate Respiratory: normal effort GI: Abd soft, non-tender. Pos BS x 4 MS: Extremities nontender, no edema, normal ROM Neurologic: Alert and oriented x 4.  GU: Neg CVAT.  PELVIC EXAM: Cervix pink, visually closed, normal extropion at os, No IUD strings visible, scant pink discharge, vaginal walls and external genitalia normal Bimanual exam: Cervix 0/long/high, firm, anterior, neg CMT, uterus nontender, nonenlarged, adnexa without tenderness, enlargement, or mass   LAB RESULTS Results for orders placed or performed during the hospital encounter of 03/25/18 (from the past 24 hour(s))  Urinalysis, Routine w reflex microscopic     Status: Abnormal   Collection Time: 03/25/18  9:31 AM  Result Value Ref Range   Color, Urine YELLOW YELLOW   APPearance CLEAR CLEAR   Specific Gravity, Urine 1.011 1.005 - 1.030   pH 8.0 5.0 - 8.0   Glucose, UA NEGATIVE NEGATIVE mg/dL   Hgb urine dipstick SMALL (A) NEGATIVE   Bilirubin Urine NEGATIVE NEGATIVE   Ketones, ur NEGATIVE NEGATIVE mg/dL   Protein, ur 30 (A) NEGATIVE mg/dL   Nitrite NEGATIVE NEGATIVE   Leukocytes, UA NEGATIVE NEGATIVE   RBC / HPF 0-5 0 - 5 RBC/hpf   WBC, UA 0-5 0 - 5 WBC/hpf   Bacteria, UA RARE (A) NONE SEEN   Squamous Epithelial / LPF 0-5 0 - 5   Mucus PRESENT   Pregnancy, urine POC     Status: None   Collection Time: 03/25/18  9:48 AM  Result Value Ref Range   Preg Test, Ur NEGATIVE NEGATIVE  Wet prep, genital     Status: Abnormal   Collection  Time: 03/25/18 10:58 AM  Result Value Ref Range   Yeast Wet Prep HPF POC NONE SEEN NONE SEEN   Trich, Wet Prep NONE SEEN NONE SEEN   Clue Cells Wet Prep HPF POC NONE SEEN NONE SEEN   WBC, Wet Prep HPF POC MANY (A) NONE SEEN   Sperm NONE SEEN   CBC     Status: None   Collection Time: 03/25/18 11:24 AM  Result Value Ref Range   WBC 4.6 4.0 - 10.5 K/uL   RBC 4.62 3.87 - 5.11  MIL/uL   Hemoglobin 13.7 12.0 - 15.0 g/dL   HCT 39.3 36.0 - 46.0 %   MCV 85.1 80.0 - 100.0 fL   MCH 29.7 26.0 - 34.0 pg   MCHC 34.9 30.0 - 36.0 g/dL   RDW 12.5 11.5 - 15.5 %   Platelets 241 150 - 400 K/uL   nRBC 0.0 0.0 - 0.2 %  Comprehensive metabolic panel     Status: Abnormal   Collection Time: 03/25/18 11:24 AM  Result Value Ref Range   Sodium 139 135 - 145 mmol/L   Potassium 3.7 3.5 - 5.1 mmol/L   Chloride 106 98 - 111 mmol/L   CO2 25 22 - 32 mmol/L   Glucose, Bld 113 (H) 70 - 99 mg/dL   BUN 10 6 - 20 mg/dL   Creatinine, Ser 0.54 0.44 - 1.00 mg/dL   Calcium 8.9 8.9 - 10.3 mg/dL   Total Protein 7.6 6.5 - 8.1 g/dL   Albumin 3.7 3.5 - 5.0 g/dL   AST 27 15 - 41 U/L   ALT 29 0 - 44 U/L   Alkaline Phosphatase 45 38 - 126 U/L   Total Bilirubin 0.8 0.3 - 1.2 mg/dL   GFR calc non Af Amer >60 >60 mL/min   GFR calc Af Amer >60 >60 mL/min   Anion gap 8 5 - 15  Lipase, blood     Status: None   Collection Time: 03/25/18 11:24 AM  Result Value Ref Range   Lipase 29 11 - 51 U/L       IMAGING US Pelvic Complete With Transvaginal  Result Date: 03/25/2018 CLINICAL DATA:  Acute pelvic pain after IUD placement EXAM: TRANSABDOMINAL AND TRANSVAGINAL ULTRASOUND OF PELVIS TECHNIQUE: Both transabdominal and transvaginal ultrasound examinations of the pelvis were performed. Transabdominal technique was performed for global imaging of the pelvis including uterus, ovaries, adnexal regions, and pelvic cul-de-sac. It was necessary to proceed with endovaginal exam following the transabdominal exam to visualize the uterus,  endometrium and ovaries. COMPARISON:  CT [facility] 01/22/18 FINDINGS: Uterus Measurements: 12.84 x 7.07 x 6.5 cm. Within the anterior myometrium there is a partially degenerated fibroid which measures 4 x 3.7 by 3.6 cm. Adjacent fibroid measures 1.7 x 1.5 x 1.7 cm. Endometrium Thickness: 3.2 mm. Appropriately placed IUD identified within the endometrial cavity. Right ovary Measurements: 3.1 x 2.4 x 3.1 cm. Normal appearance/no adnexal mass. Left ovary Measurements: 3.0 x 1.6 x 2.4 cm. Normal appearance/no adnexal mass. Other findings No abnormal free fluid. IMPRESSION: 1. The IUD is identified within the endometrial cavity and appears to have and appropriate position. 2. Uterine fibroids. The larger of the 2 fibroids appears partially degenerated. 3. No acute findings identified. Electronically Signed   By: Kerby Moors M.D.   On: 03/25/2018 13:59    MAU Management/MDM: Pt with new onset pain 2 months ago, previous evaluation, including CT scan negative.  No acute/surgical abdomen on today's exam. Pt VS stable.  CBC, CMP, lipase done today and pelvic US to evaluate IUD.   [place] with evidence of fibroids and one fibroid degenerating, which is likely cause of pt pain.  IUD is in place and normal.   Toradol 60 mg IM and Percocet 5/325 x 2 tabs given in MAU and pt reported pain relief. Rx for Naproxen 500 mg BID and Percocet 5/325, take 1-2 tablets Q 6 hours PRN x 20 tabs.   Pt to [facility] office with MD. Consult Dr Nehemiah Settle with assessment and findings.  Pt discharged with strict return precautions.  ASSESSMENT 1. Uterine leiomyoma, unspecified location   2. Acute pelvic pain, female   3. Abdominal pain in female patient   4. IUD (intrauterine device) in place     PLAN Discharge   Allergies as of 03/25/2018   No Known Allergies     Medication List    STOP taking these medications   ibuprofen 600 MG tablet Commonly known as:  ADVIL,MOTRIN   predniSONE 20 MG tablet Commonly known as:   DELTASONE     TAKE these medications   cyclobenzaprine 10 MG tablet Commonly known as:  FLEXERIL Take 1 tablet (10 mg total) by mouth 2 (two) times daily as needed for muscle spasms.   dicyclomine 10 MG capsule Commonly known as:  BENTYL Take 1 capsule (10 mg total) by mouth 3 (three) times daily before meals.   naproxen 500 MG tablet Commonly known as:  NAPROSYN Take 1 tablet (500 mg total) by mouth 2 (two) times daily with a meal.   ondansetron 4 MG disintegrating tablet Commonly known as:  ZOFRAN-ODT Take 1 tablet (4 mg total) by mouth every 8 (eight) hours as needed for nausea or vomiting.   oxyCODONE-acetaminophen 5-325 MG tablet Commonly known as:  PERCOCET/ROXICET Take 1-2 tablets by mouth every 6 (six) hours as needed for severe pain.   pantoprazole 20 MG tablet Commonly known as:  PROTONIX Take 1 tablet (20 mg total) by mouth daily.   promethazine 25 MG tablet Commonly known as:  PHENERGAN Take 1 tablet (25 mg total) by mouth every 6 (six) hours as needed for nausea or vomiting.   sucralfate 1 g tablet Commonly known as:  CARAFATE Take 1 tablet (1 g total) by mouth 4 (four) times daily -  with meals and at bedtime for 7 days.      Follow-up [facility] for [place] Follow up.   Specialty:  Obstetrics and Gynecology Why:  The office will call you to set up appointment. Return to MAU as needed for emergencies.  Contact information: [facility] [place] Certified Nurse-Midwife 03/25/2018  7:10 PM

## 2018-03-26 ENCOUNTER — Encounter: Payer: Self-pay | Admitting: Obstetrics and Gynecology

## 2018-03-26 LAB — GC/CHLAMYDIA PROBE AMP (~~LOC~~) NOT AT ARMC
CHLAMYDIA, DNA PROBE: NEGATIVE
NEISSERIA GONORRHEA: NEGATIVE

## 2018-04-22 ENCOUNTER — Encounter: Payer: Medicaid Other | Admitting: Obstetrics and Gynecology

## 2018-08-25 ENCOUNTER — Ambulatory Visit: Payer: Self-pay | Admitting: Internal Medicine

## 2018-09-07 ENCOUNTER — Telehealth: Payer: Self-pay

## 2018-09-07 NOTE — Telephone Encounter (Signed)
Patient called in stating she is having fever, body aches and congestion x 3 days. Per Dr. Amil Amen have patient come in tomorrow to do flu testing. Patient advised she can take tylenol for fever and body ches but do not take any medicine 8 hours prior to coming into office. Patient verbalized understanding.

## 2018-09-08 ENCOUNTER — Encounter: Payer: Self-pay | Admitting: Internal Medicine

## 2018-09-08 ENCOUNTER — Ambulatory Visit (INDEPENDENT_AMBULATORY_CARE_PROVIDER_SITE_OTHER): Payer: Self-pay | Admitting: Internal Medicine

## 2018-09-08 ENCOUNTER — Other Ambulatory Visit: Payer: Self-pay

## 2018-09-08 VITALS — HR 58 | Temp 98.3°F | Resp 12

## 2018-09-08 DIAGNOSIS — R6889 Other general symptoms and signs: Secondary | ICD-10-CM

## 2018-09-08 LAB — POCT INFLUENZA A/B
Influenza A, POC: NEGATIVE
Influenza B, POC: NEGATIVE

## 2018-09-08 NOTE — Progress Notes (Signed)
    Subjective:    Patient ID: Raven Porter, female   DOB: 1976-03-09, 43 y.o.   MRN: [id number]   HPI  3 days of sore throat, cough, posterior pharyngeal drainage and body aches.  No fever.    No outpatient medications have been marked as taking for the 09/08/18 encounter (Office Visit) with Mack Hook, MD.   No Known Allergies   Review of Systems    Objective:   Pulse (!) 58   Temp 98.3 F (36.8 C) (Oral)   Resp 12   SpO2 99%   Physical Exam  Hoarse sounding. Lungs:  CTA with good air movement   Assessment & Plan   URI symptoms without fever.  Influenza testing negative.   Offered to test for COVID-19, but patient did not feel she could tolerate another NP swab. Grabbed tester's hand and pulled away with the influenza testing. Letter to be off work for 14 days. Handout on how to protect her 3 children at home should this be COVID-19 (or other respiratory virus)

## 2018-09-08 NOTE — Patient Instructions (Signed)
COVID-19 infection prevention given to patient to follow at home.

## 2018-09-23 ENCOUNTER — Encounter: Payer: Self-pay | Admitting: Internal Medicine

## 2018-09-23 ENCOUNTER — Other Ambulatory Visit: Payer: Self-pay

## 2018-09-23 ENCOUNTER — Ambulatory Visit: Payer: Medicaid Other | Admitting: Internal Medicine

## 2018-09-23 VITALS — BP 122/80 | HR 66 | Resp 12 | Ht 61.0 in | Wt 235.0 lb

## 2018-09-23 DIAGNOSIS — E119 Type 2 diabetes mellitus without complications: Secondary | ICD-10-CM

## 2018-09-23 DIAGNOSIS — R1013 Epigastric pain: Secondary | ICD-10-CM | POA: Diagnosis not present

## 2018-09-23 DIAGNOSIS — F439 Reaction to severe stress, unspecified: Secondary | ICD-10-CM

## 2018-09-23 DIAGNOSIS — N912 Amenorrhea, unspecified: Secondary | ICD-10-CM | POA: Diagnosis not present

## 2018-09-23 LAB — POCT URINE PREGNANCY: Preg Test, Ur: NEGATIVE

## 2018-09-23 MED ORDER — FAMOTIDINE 20 MG PO TABS: ORAL_TABLET | ORAL | 6 refills | Status: AC

## 2018-09-23 NOTE — Patient Instructions (Addendum)
Gastroesophageal Reflux Disease, Adult Gastroesophageal reflux (GER) happens when acid from the stomach flows up into the tube that connects the mouth and the stomach (esophagus). Normally, food travels down the esophagus and stays in the stomach to be digested. However, when a person has GER, food and stomach acid sometimes move back up into the esophagus. If this becomes a more serious problem, the person may be diagnosed with a disease called gastroesophageal reflux disease (GERD). GERD occurs when the reflux:  Happens often.  Causes frequent or severe symptoms.  Causes problems such as damage to the esophagus. When stomach acid comes in contact with the esophagus, the acid may cause soreness (inflammation) in the esophagus. Over time, GERD may create small holes (ulcers) in the lining of the esophagus. What are the causes? This condition is caused by a problem with the muscle between the esophagus and the stomach (lower esophageal sphincter, or LES). Normally, the LES muscle closes after food passes through the esophagus to the stomach. When the LES is weakened or abnormal, it does not close properly, and that allows food and stomach acid to go back up into the esophagus. The LES can be weakened by certain dietary substances, medicines, and medical conditions, including:  Tobacco use.  Pregnancy.  Having a hiatal hernia.  Alcohol use.  Certain foods and beverages, such as coffee, chocolate, onions, and peppermint. What increases the risk? You are more likely to develop this condition if you:  Have an increased body weight.  Have a connective tissue disorder.  Use NSAID medicines. What are the signs or symptoms? Symptoms of this condition include:  Heartburn.  Difficult or painful swallowing.  The feeling of having a lump in the throat.  Abitter taste in the mouth.  Bad breath.  Having a large amount of saliva.  Having an upset or bloated  stomach.  Belching.  Chest pain. Different conditions can cause chest pain. Make sure you see your health care provider if you experience chest pain.  Shortness of breath or wheezing.  Ongoing (chronic) cough or a night-time cough.  Wearing away of tooth enamel.  Weight loss. How is this diagnosed? Your health care provider will take a medical history and perform a physical exam. To determine if you have mild or severe GERD, your health care provider may also monitor how you respond to treatment. You may also have tests, including:  A test to examine your stomach and esophagus with a small camera (endoscopy).  A test thatmeasures the acidity level in your esophagus.  A test thatmeasures how much pressure is on your esophagus.  A barium swallow or modified barium swallow test to show the shape, size, and functioning of your esophagus. How is this treated? The goal of treatment is to help relieve your symptoms and to prevent complications. Treatment for this condition may vary depending on how severe your symptoms are. Your health care provider may recommend:  Changes to your diet.  Medicine.  Surgery. Follow these instructions at home: Eating and drinking   Follow a diet as recommended by your health care provider. This may involve avoiding foods and drinks such as: ? Coffee and tea (with or without caffeine). ? Drinks that containalcohol. ? Energy drinks and sports drinks. ? Carbonated drinks or sodas. ? Chocolate and cocoa. ? Peppermint and mint flavorings. ? Garlic and onions. ? Horseradish. ? Spicy and acidic foods, including peppers, chili powder, curry powder, vinegar, hot sauces, and barbecue sauce. ? Citrus fruit juices and citrus  fruits, such as oranges, lemons, and limes. ? Tomato-based foods, such as red sauce, chili, salsa, and pizza with red sauce. ? Fried and fatty foods, such as donuts, french fries, potato chips, and high-fat dressings. ? High-fat  meats, such as hot dogs and fatty cuts of red and white meats, such as rib eye steak, sausage, ham, and bacon. ? High-fat dairy items, such as whole milk, butter, and cream cheese.  Eat small, frequent meals instead of large meals.  Avoid drinking large amounts of liquid with your meals.  Avoid eating meals during the 2-3 hours before bedtime.  Avoid lying down right after you eat.  Do not exercise right after you eat. Lifestyle   Do not use any products that contain nicotine or tobacco, such as cigarettes, e-cigarettes, and chewing tobacco. If you need help quitting, ask your health care provider.  Try to reduce your stress by using methods such as yoga or meditation. If you need help reducing stress, ask your health care provider.  If you are overweight, reduce your weight to an amount that is healthy for you. Ask your health care provider for guidance about a safe weight loss goal. General instructions  Pay attention to any changes in your symptoms.  Take over-the-counter and prescription medicines only as told by your health care provider. Do not take aspirin, ibuprofen, or other NSAIDs unless your health care provider told you to do so.  Wear loose-fitting clothing. Do not wear anything tight around your waist that causes pressure on your abdomen.  Raise (elevate) the head of your bed about 6 inches (15 cm).  Avoid bending over if this makes your symptoms worse.  Keep all follow-up visits as told by your health care provider. This is important. Contact a health care provider if:  You have: ? New symptoms. ? Unexplained weight loss. ? Difficulty swallowing or it hurts to swallow. ? Wheezing or a persistent cough. ? A hoarse voice.  Your symptoms do not improve with treatment. Get help right away if you:  Have pain in your arms, neck, jaw, teeth, or back.  Feel sweaty, dizzy, or light-headed.  Have chest pain or shortness of breath.  Vomit and your vomit looks  like blood or coffee grounds.  Faint.  Have stool that is bloody or black.  Cannot swallow, drink, or eat. Summary  Gastroesophageal reflux happens when acid from the stomach flows up into the esophagus. GERD is a disease in which the reflux happens often, causes frequent or severe symptoms, or causes problems such as damage to the esophagus.  Treatment for this condition may vary depending on how severe your symptoms are. Your health care provider may recommend diet and lifestyle changes, medicine, or surgery.  Contact a health care provider if you have new or worsening symptoms.  Take over-the-counter and prescription medicines only as told by your health care provider. Do not take aspirin, ibuprofen, or other NSAIDs unless your health care provider told you to do so.  Keep all follow-up visits as told by your health care provider. This is important. This information is not intended to replace advice given to you by your health care provider. Make sure you discuss any questions you have with your health care provider. Document Released: 03/05/2005 Document Revised: 12/02/2017 Document Reviewed: 12/02/2017 Elsevier Interactive Patient Education  2019 [employer].   Drink a glass of water before every meal Drink 6-8 glasses of water daily Eat three meals daily Eat a protein and healthy fat  with every meal (eggs,fish, chicken, [place] and limit red meats) Eat 5 servings of vegetables daily, mix the colors Eat 2 servings of fruit daily with skin, if skin is edible Use smaller plates Put food/utensils down as you chew and swallow each bite Eat at a table with friends/family at least once daily, no TV Do not eat in front of the TV  Recent studies show that people who consume all of their calories in a 12 hour period lose weight more efficiently.  For example, if you eat your first meal at 7:00 a.m., your last meal of the day should be completed by 7:00 p.m.

## 2018-09-23 NOTE — Progress Notes (Signed)
Subjective:    Patient ID: Raven Porter, female   DOB: Jun 11, 1975, 43 y.o.   MRN: [id number]   HPI   Here as she thinks she is pregnant.  She has only been here for a febrile illness at beginning of month and was treated as possible COVID-19 case. She states she was ill for 2 more days and then resolved. Her children did not get ill.  She is currently having sensation of feeling like something is moving in her epigastric area.  Feels like she was having this for the past month.  She thinks this movement feels like a baby moving. Has not really ever had a regular period.  She has been having periods monthly until April.   LMP was March 19th. She does have a Mirena, but has never been able to find the strings.   She had an ultrasound in October of 2019 identifying IUD as being in proper position. She has performed OTC urine pregnancy tests at home that have been negative.  She has noted nausea when she eats and when she lies back.  Feels she will vomit.   Denies acid sensation into throat or heartburn. She also has associated pain in her mid thoracic back, level with epigastric area.   Sometimes the back pain seems to radiate from the epigastric area, but can have back pain when lying back and also with walking.   Was taking Ibuprofen 600 mg up to twice daily or her back pain but stopped after seen here beginning of April No associated weight loss.  States she was prescribed Pantoprazole in past year, but cannot recall why.  States she stopped it because she got better, but not sure better from what. Records indicate this was started in August of 2019 after and ED visit for abdominal pain with nausea and vomiting. CT of abdomen and pelvis was unremarkable then.    Diet without much in way of chocolate. No current coffee or caffeinated  Beverages. No smoking or alcohol intake. Eats a lot of onions and tomatoes. Not taking any NSAIDS since about 3 weeks. She has not noted a particular  type of food setting off the pain. No melena or hematochezia.     She is very stressed. She is single with 3 children:  57, 56, 65 yo children at home. She has been off work from [employer] due to her previous illness.  Plans to go back tomorrow.   Originally from [place].  Has lived in [place]. since 2002.   Originally lived in [place] with her husband and family.   Moved to [place] and lived there many years.   Divorced in 2012 and her ex husband has just disappeared from their lives--no support and no contact. Moved to [place] 2 years ago.   She has a friend here, but because she does not drive, they do not get together. She plans to move back to [place] once the COVID-19 pandemic calms down. Can't sleep at night.  Up and down all night with worry. Does not have transportation.  A co worker takes her back and forth to work--she pays them. Does have food.    Looking through old records, patient appears to have been diagnosed with DM.  She has never been treated for this. She does urinate a lot.  Her urine today was not a lot and was relatively concentrated.  Past Medical History:  Diagnosis Date  . Diabetes mellitus without complication ([place])  Past Surgical History:  Procedure Laterality Date  . CESAREAN SECTION     Social History   Socioeconomic History  . Marital status: Divorced    Spouse name: Not on file  . Number of children: Not on file  . Years of education: Not on file  . Highest education level: Not on file  Occupational History  . Occupation: Production designer, theatre/television/film  Social Needs  . Financial resource strain: Not on file  . Food insecurity:    Worry: Never true    Inability: Never true  . Transportation needs:    Medical: Yes    Non-medical: Yes  Tobacco Use  . Smoking status: Never Smoker  . Smokeless tobacco: Never Used  Substance and Sexual Activity  . Alcohol use: No  . Drug use: Never  . Sexual activity: Not Currently    Birth control/protection: I.U.D.   Lifestyle  . Physical activity:    Days per week: Not on file    Minutes per session: Not on file  . Stress: Not on file  Relationships  . Social connections:    Talks on phone: Not on file    Gets together: Not on file    Attends religious service: Not on file    Active member of club or organization: Not on file    Attends meetings of clubs or organizations: Not on file    Relationship status: Not on file  . Intimate partner violence:    Fear of current or ex partner: No    Emotionally abused: No    Physically abused: No    Forced sexual activity: No  Other Topics Concern  . Not on file  Social History Narrative   Divorced.   Exhusband has no contact with her or his children.    He does not supply any financial support to the family   Boyfriend of 1 year recently asked to move out.  He also did little to support the family   Lives at home with 3 children, ages 79, 21, 33 in 38.     No outpatient medications have been marked as taking for the 09/23/18 encounter (Office Visit) with Mack Hook, MD.   No Known Allergies   Review of Systems    Objective:   BP 122/80 (BP Location: Left Arm, Patient Position: Sitting, Cuff Size: Large)   Pulse 66   Resp 12   Ht 5\' 1"  (1.549 m)   Wt 235 lb (106.6 kg)   LMP  (LMP Unknown)   BMI 44.40 kg/m   Physical Exam  NAD Lungs:  CTA CV:  RRR with normal S1 and S2, No S3, S4 or murmur.   Radial and DP pulses normal and equal Abd:  S, NT, No HSM or mass, + BS LE:  No edema MS:  Mild tenderness of paraspinous musculature mid thoracic back.   Assessment & Plan  1.  Epigastric pain:  Likely gastritis/PUD or possibly GERD:  GERD precautions and diet discussed at length. Famotidine 20 mg 2 tabs at bedtime. CBC, CMP.  2. "Missed period"  In actuality, not yet far enough in month to know if period missed.  Urine HCG negative today.  3.  Hyperglycemia, with possibility of DM:  CMP with A1C today.  She is fasting, so  will check FLP as well.  4.  Morbid obesity:  Discussed improving diet  5.  Stress:  Will ask MSW intern, Morey Hummingbird to call.  Patient also shares at end of  visit that her live in boyfriend of 1 year was asked to leave the household recently. She is the only household support.

## 2018-09-24 LAB — CBC WITH DIFFERENTIAL/PLATELET
Basophils Absolute: 0.1 10*3/uL (ref 0.0–0.2)
Basos: 1 %
EOS (ABSOLUTE): 0.1 10*3/uL (ref 0.0–0.4)
Eos: 2 %
Hematocrit: 37.4 % (ref 34.0–46.6)
Hemoglobin: 13.1 g/dL (ref 11.1–15.9)
Immature Grans (Abs): 0 10*3/uL (ref 0.0–0.1)
Immature Granulocytes: 0 %
Lymphocytes Absolute: 2.4 10*3/uL (ref 0.7–3.1)
Lymphs: 46 %
MCH: 29.6 pg (ref 26.6–33.0)
MCHC: 35 g/dL (ref 31.5–35.7)
MCV: 84 fL (ref 79–97)
Monocytes Absolute: 0.4 10*3/uL (ref 0.1–0.9)
Monocytes: 7 %
Neutrophils Absolute: 2.3 10*3/uL (ref 1.4–7.0)
Neutrophils: 44 %
Platelets: 302 10*3/uL (ref 150–450)
RBC: 4.43 x10E6/uL (ref 3.77–5.28)
RDW: 12.6 % (ref 11.7–15.4)
WBC: 5.2 10*3/uL (ref 3.4–10.8)

## 2018-09-24 LAB — COMPREHENSIVE METABOLIC PANEL
ALT: 25 IU/L (ref 0–32)
AST: 21 IU/L (ref 0–40)
Albumin/Globulin Ratio: 1.4 (ref 1.2–2.2)
Albumin: 4 g/dL (ref 3.8–4.8)
Alkaline Phosphatase: 51 IU/L (ref 39–117)
BUN/Creatinine Ratio: 27 — ABNORMAL HIGH (ref 9–23)
BUN: 13 mg/dL (ref 6–24)
Bilirubin Total: 0.4 mg/dL (ref 0.0–1.2)
CO2: 24 mmol/L (ref 20–29)
Calcium: 9.5 mg/dL (ref 8.7–10.2)
Chloride: 101 mmol/L (ref 96–106)
Creatinine, Ser: 0.49 mg/dL — ABNORMAL LOW (ref 0.57–1.00)
GFR calc Af Amer: 139 mL/min/{1.73_m2} (ref >59)
GFR calc non Af Amer: 121 mL/min/{1.73_m2} (ref >59)
Globulin, Total: 2.9 g/dL (ref 1.5–4.5)
Glucose: 135 mg/dL — ABNORMAL HIGH (ref 65–99)
Potassium: 3.9 mmol/L (ref 3.5–5.2)
Sodium: 139 mmol/L (ref 134–144)
Total Protein: 6.9 g/dL (ref 6.0–8.5)

## 2018-09-24 LAB — LIPID PANEL W/O CHOL/HDL RATIO
Cholesterol, Total: 198 mg/dL (ref 100–199)
HDL: 43 mg/dL (ref >39)
LDL Calculated: 130 mg/dL — ABNORMAL HIGH (ref 0–99)
Triglycerides: 126 mg/dL (ref 0–149)
VLDL Cholesterol Cal: 25 mg/dL (ref 5–40)

## 2018-09-24 LAB — HGB A1C W/O EAG: Hgb A1c MFr Bld: 6.8 % — ABNORMAL HIGH (ref 4.8–5.6)

## 2018-09-24 MED ORDER — METFORMIN HCL 500 MG PO TABS: ORAL_TABLET | ORAL | 11 refills | Status: DC

## 2018-09-24 NOTE — Addendum Note (Signed)
Addended by: Marcelino Duster on: 09/24/2018 08:13 AM   Modules accepted: Orders

## 2018-09-27 ENCOUNTER — Ambulatory Visit: Payer: Self-pay | Admitting: Internal Medicine

## 2018-11-24 ENCOUNTER — Ambulatory Visit: Payer: Medicaid Other | Admitting: Internal Medicine

## 2019-01-12 ENCOUNTER — Ambulatory Visit: Payer: Medicaid Other | Admitting: Internal Medicine

## 2019-02-16 ENCOUNTER — Ambulatory Visit: Payer: Medicaid Other | Admitting: Internal Medicine

## 2019-02-17 ENCOUNTER — Encounter: Payer: Self-pay | Admitting: Internal Medicine

## 2019-05-01 IMAGING — CT CT ABD-PELV W/ CM
2 of 5 series · 17 of 46 positions shown, 19 images · IV contrast (Omni 300)
Comparison: Chest x-ray 01/22/2018

CLINICAL DATA: Abdominal pain nausea radiating to the back

EXAM:
CT ABDOMEN AND PELVIS WITH CONTRAST
TECHNIQUE: Multidetector CT imaging of the abdomen and pelvis was performed
using the standard protocol following bolus administration of
intravenous contrast.
CONTRAST:  100mL OMNIPAQUE IOHEXOL 300 MG/ML  SOLN

[Series 3: a/p w/ 5mm · axial · 0.83mm/px · z∈[+1150,+1550]mm · 14 of 90 slices shown, 16 images]
[im 5/90  soft-tissue]
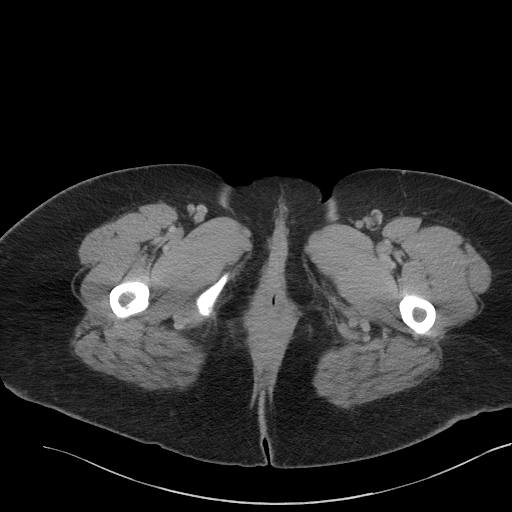
[im 5/90  bone]
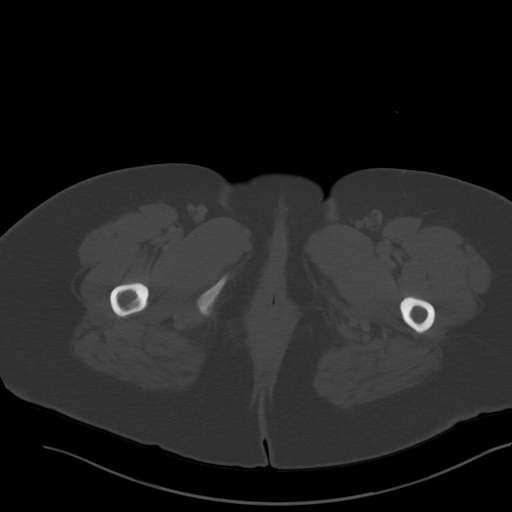
[im 10/90  soft-tissue]
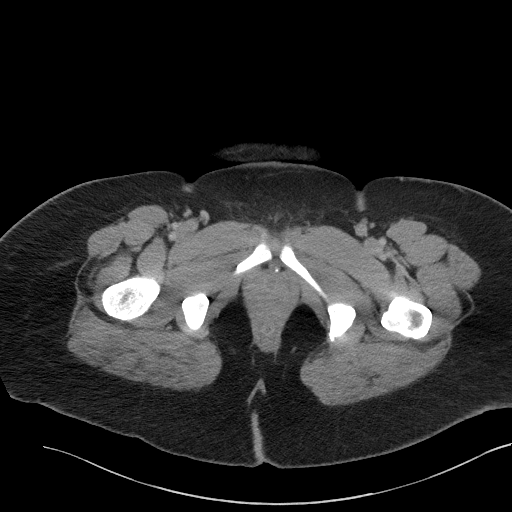
[im 20/90  soft-tissue]
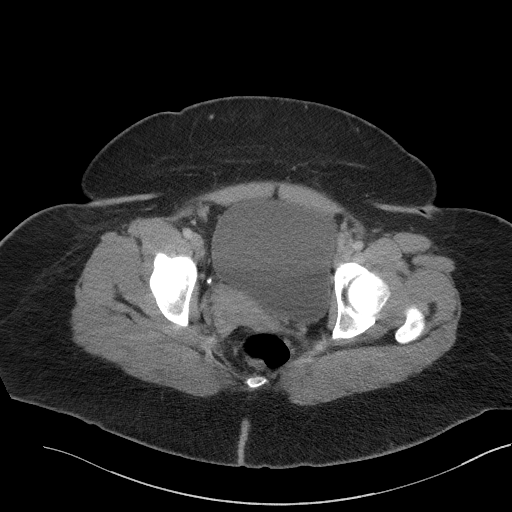
[im 25/90  soft-tissue]
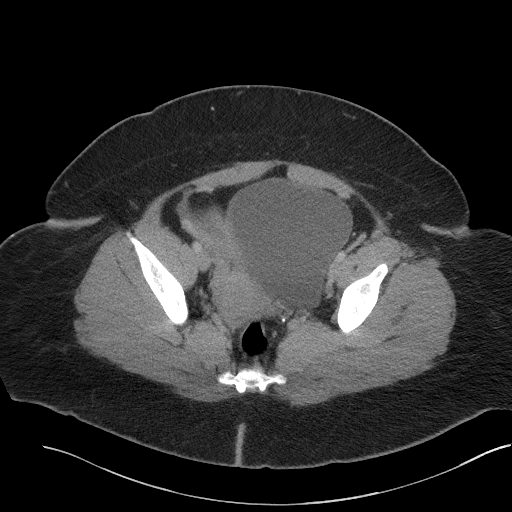
[im 30/90  soft-tissue]
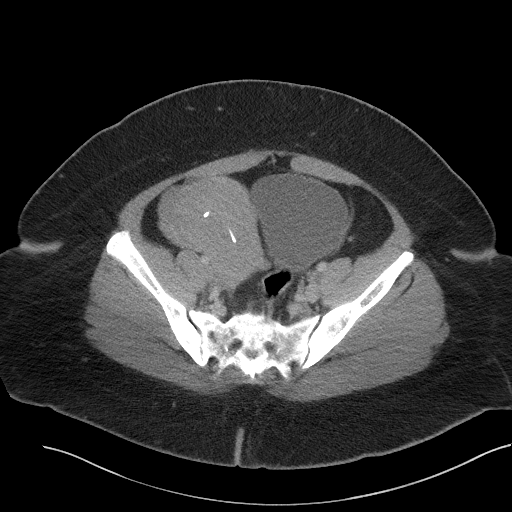
[im 35/90  soft-tissue]
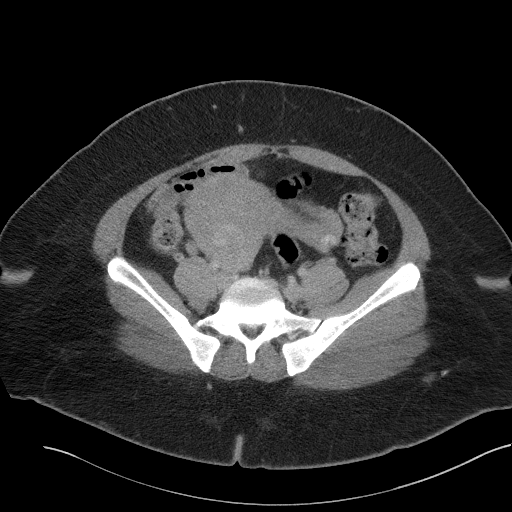
[im 40/90  soft-tissue]
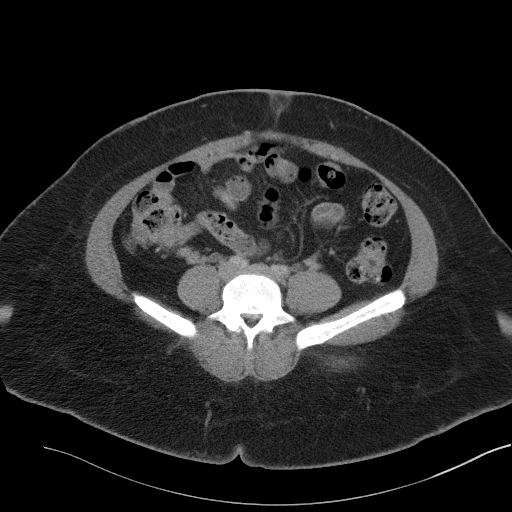
[im 50/90  soft-tissue]
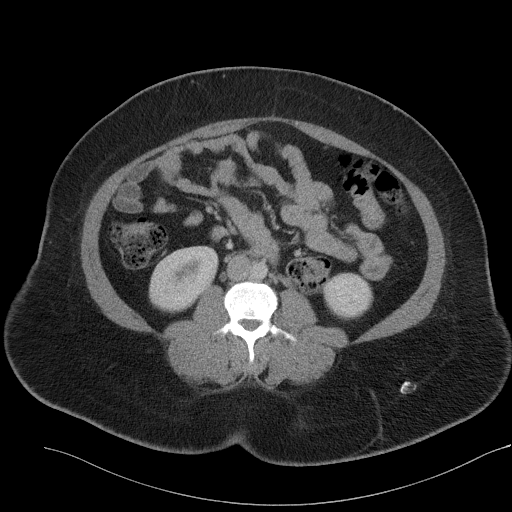
[im 55/90  soft-tissue]
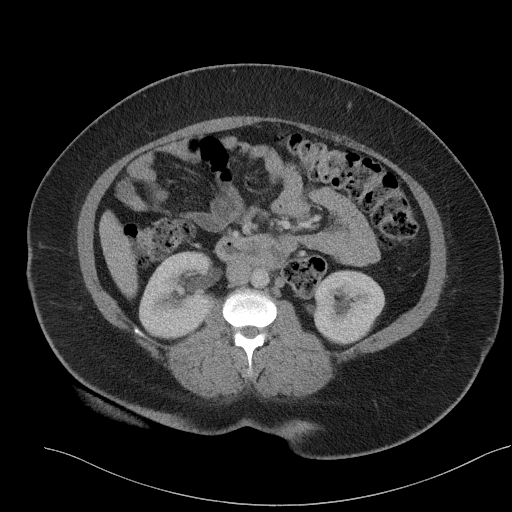
[im 55/90  bone]
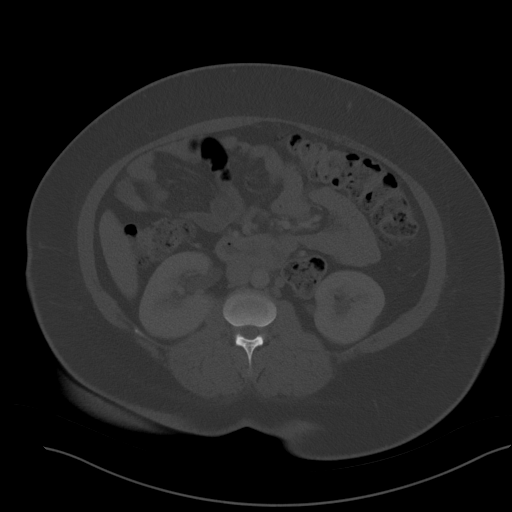
[im 60/90  soft-tissue]
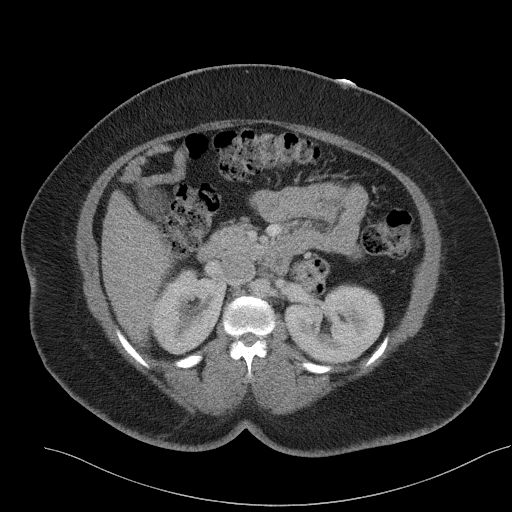
[im 65/90  soft-tissue]
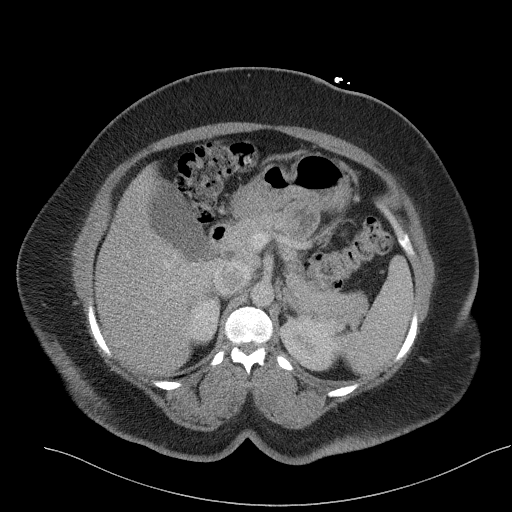
[im 70/90  soft-tissue]
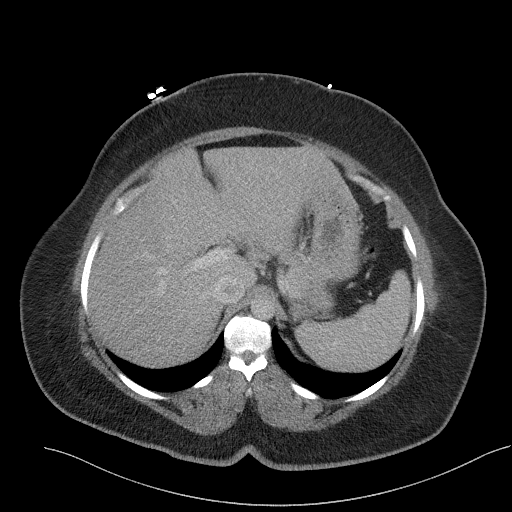
[im 80/90  soft-tissue]
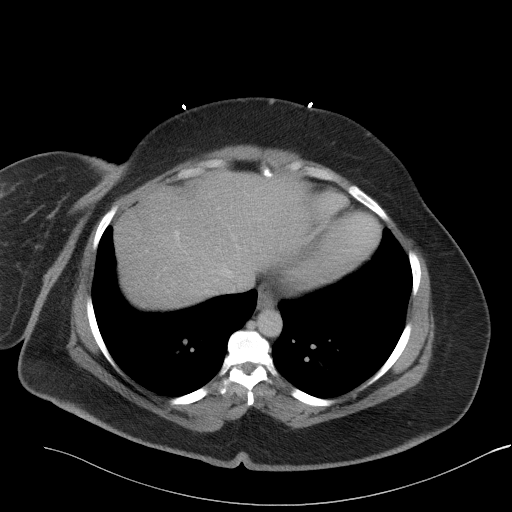
[im 85/90  soft-tissue]
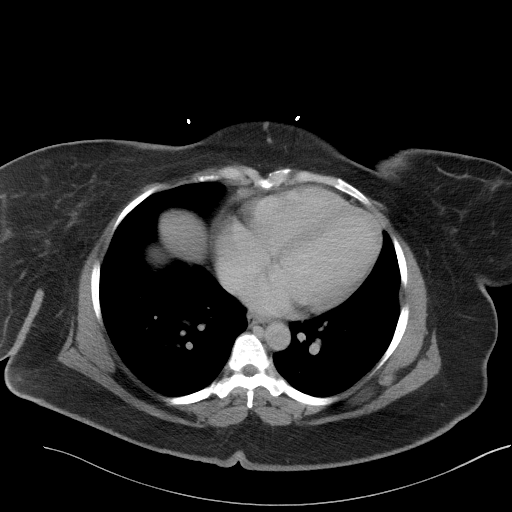

[Series 6: a/p w/ cor · coronal · 0.83mm/px · 3 of 176 slices shown]
[im 59/176  soft-tissue]
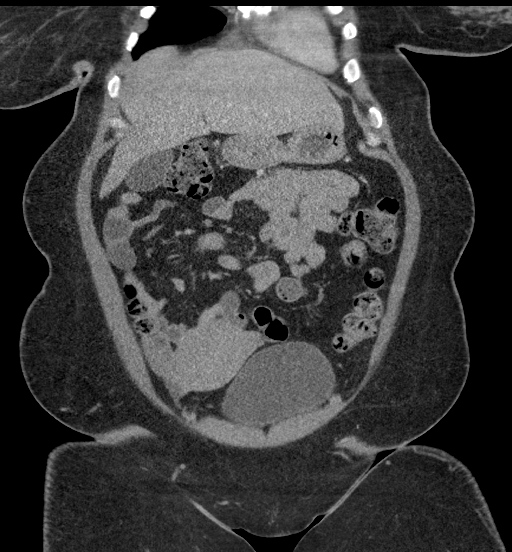
[im 78/176  soft-tissue]
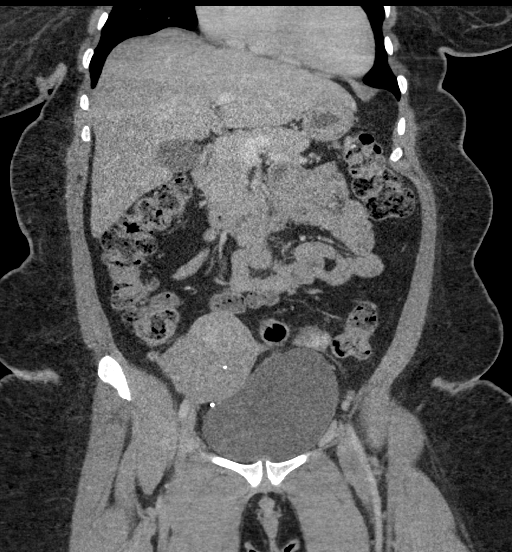
[im 98/176  soft-tissue]
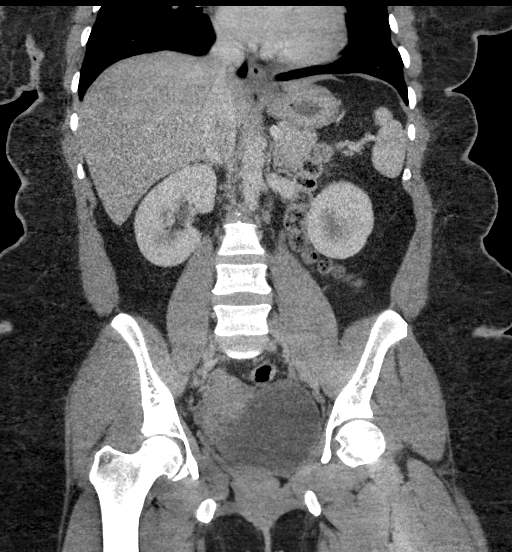

[17 of 46 positions shown; findings below may reference images not displayed]

FINDINGS: Lower chest: Lung bases demonstrate no acute consolidation or
pleural effusion. Mild cardiomegaly.

Hepatobiliary: No focal liver abnormality is seen. No gallstones,
gallbladder wall thickening, or biliary dilatation.

Pancreas: Unremarkable. No pancreatic ductal dilatation or
surrounding inflammatory changes.

Spleen: Normal in size without focal abnormality.

Adrenals/Urinary Tract: Adrenal glands are unremarkable. Kidneys are
normal, without renal calculi, focal lesion, or hydronephrosis.
Bladder is unremarkable.

Stomach/Bowel: Stomach is within normal limits. Appendix appears
normal. No evidence of bowel wall thickening, distention, or
inflammatory changes.

Vascular/Lymphatic: No significant vascular findings are present. No
enlarged abdominal or pelvic lymph nodes.

Reproductive: Intrauterine device in the uterus. Minimal
hyperdensity in the left adnexa may reflect collapsing cyst or
slightly prominent vascular enhancement.

Other: Small fat in the umbilical region. No free air or free fluid.

Musculoskeletal: No acute or significant osseous findings.
IMPRESSION: No CT evidence for acute intra-abdominal or pelvic abnormality. Mild
cardiomegaly.

## 2019-09-14 ENCOUNTER — Encounter: Payer: Self-pay | Admitting: Internal Medicine

## 2019-09-14 ENCOUNTER — Ambulatory Visit (INDEPENDENT_AMBULATORY_CARE_PROVIDER_SITE_OTHER): Payer: Self-pay | Admitting: Internal Medicine

## 2019-09-14 ENCOUNTER — Other Ambulatory Visit: Payer: Self-pay

## 2019-09-14 VITALS — BP 122/84 | HR 70 | Resp 12 | Ht 61.0 in | Wt 238.0 lb

## 2019-09-14 DIAGNOSIS — E119 Type 2 diabetes mellitus without complications: Secondary | ICD-10-CM

## 2019-09-14 DIAGNOSIS — M25561 Pain in right knee: Secondary | ICD-10-CM

## 2019-09-14 DIAGNOSIS — M25562 Pain in left knee: Secondary | ICD-10-CM

## 2019-09-14 DIAGNOSIS — M545 Low back pain: Secondary | ICD-10-CM

## 2019-09-14 DIAGNOSIS — R011 Cardiac murmur, unspecified: Secondary | ICD-10-CM

## 2019-09-14 DIAGNOSIS — G8929 Other chronic pain: Secondary | ICD-10-CM

## 2019-09-14 MED ORDER — DICLOFENAC SODIUM 75 MG PO TBEC: DELAYED_RELEASE_TABLET | ORAL | 1 refills | Status: AC

## 2019-09-14 MED ORDER — CYCLOBENZAPRINE HCL 10 MG PO TABS: ORAL_TABLET | ORAL | 1 refills | Status: AC

## 2019-09-14 MED ORDER — METFORMIN HCL 500 MG PO TABS: 500.0000 mg | ORAL_TABLET | Freq: Two times a day (BID) | ORAL | 11 refills | Status: AC

## 2019-09-14 NOTE — Progress Notes (Addendum)
Subjective:    Patient ID: Raven Porter, female   DOB: 11-Mar-1976, 44 y.o.   MRN: [id number]   HPI   Here after a year's hiatus  1.  Bilateral knee pain:  Problem for about 10 months She feels the knees are swollen and red at times.  Hurts to bend her knees/deep knee bend the most.   Has been taking Aleve 1-2 tabs twice daily.  No improvement.   Sometimes Ibuprofen on other days 2-3 tabs twice daily.  No improvement.   She does not recall any injury. She does state her knees started hurting after she started giving her friend's baby baths in the shower daily for 2 weeks--describes having the newborn on her outstretched knees to bathe him in the shower.  She was sitting on what sounds like some sort of shower chair.   She does not kneel or squat to do any cleaning.  She does not recall being in these positions before the start of the pain and states she cannot get into these positions now.  Shows me a photo of right knee from her perspective looking down on outstretched leg with erythema and swelling of anterior and superior knee.  This was from August of 2020 after she had been balancing the baby above on her knees to wash him in shower.  2.  Low back pain:  Started 2 weeks ago after falling.  States she went to lean backwards against a porch, but missed it and fell to the ground, landing on her butt and low back.  Has pain in low back bilaterally which started about 2-3 days after the fall.  No radiation.  No numbness or tingling in legs.  No LE weakness.    3.  DM:  Patient never answered our phone messages nor our mailed letter.  She never started the Metformin sent out in April of last year. States she does not read [place] and her 44 yo never read her anything from [place].   She describes polyuria, but denies polydipsia.   Drinks mainly water.  Sometimes will drink Orange juice.    Diet:  Mainly one meal daily, but same if eats twice:  Rice with sauce with casava leaves.  Peanut butter  sauce, may have okra with sauce.  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  Had 3 dates this morning.  No outpatient medications have been marked as taking for the 09/14/19 encounter (Office Visit) with Mack Hook, MD.   No Known Allergies   Review of Systems    Objective:   BP 122/84 (BP Location: Left Arm, Patient Position: Sitting, Cuff Size: Large)   Pulse 70   Resp 12   Ht 5\' 1"  (1.549 m)   Wt 238 lb (108 kg)   LMP 09/11/2019   BMI 44.97 kg/m   Physical Exam Morbidly obese NAD Lungs:  CTA CV:  RRR with Grad II/VI SEM radiating to right 2nd interspace lightly.  Radial and DP pulses normal and equal Abd:  S, small umbilical hernia, reducible, No HSM or mass, + BS MS:  Tender over lower L/S spinous processes and right paraspinous musculature. Knees:  Full ROM, No effusion.  No crepitation.  Tender over bilateral medial and less so lateral collateral ligaments.  No cruciate or collateral ligament laxity.  Good extension and flexion against opposing force Neuro:  Motor 5/5, sensory to light touch normal and symmetric.  DTR's 2+/4 throughout LE.  Gait normal  Assessment & Plan   1.  Low back pain:  Mainly on right.  Cyclobenzaprine 5-10 mg at bedtime.  Diclofenac 75 mg twice daily with food  2.  Bilateral knee pain:  Mild tenderness over collateral ligaments.  Suspect she had pretibial bursitis previously and now with chronic knee pain with weight.  Will send to PT for both back and knees. Urged her to find stationary bike and get started on gradual strengthening of legs.  Unlikely to find pool access.  3.  DM:  Metformin 500 mg twice daily.  Discussed diet at length.  Return in 1 month to see how she is doing.  Needs orange card.    4.  Unable to read:  Referral to [facility].  Spent 1.25 hours with patient face to face

## 2019-09-14 NOTE — Patient Instructions (Addendum)
[facility] [address], [place], [place] [address] [phone] [email or web address] Office Hours: Monday - Friday: 8:30 - 5:00  [place] [address] [place], [place] [address]  Phone and Fax Tel [phone] Fax [phone]  See if they can help you find a Stationary bike for use.   Unsweetened almond milk 30 calories per 8 ounces--3 times daily.

## 2019-09-15 LAB — COMPREHENSIVE METABOLIC PANEL
ALT: 27 IU/L (ref 0–32)
AST: 28 IU/L (ref 0–40)
Albumin/Globulin Ratio: 1.4 (ref 1.2–2.2)
Albumin: 4.2 g/dL (ref 3.8–4.8)
Alkaline Phosphatase: 55 IU/L (ref 39–117)
BUN/Creatinine Ratio: 10 (ref 9–23)
BUN: 6 mg/dL (ref 6–24)
Bilirubin Total: 0.3 mg/dL (ref 0.0–1.2)
CO2: 25 mmol/L (ref 20–29)
Calcium: 9.1 mg/dL (ref 8.7–10.2)
Chloride: 102 mmol/L (ref 96–106)
Creatinine, Ser: 0.58 mg/dL (ref 0.57–1.00)
GFR calc Af Amer: 131 mL/min/{1.73_m2} (ref >59)
GFR calc non Af Amer: 113 mL/min/{1.73_m2} (ref >59)
Globulin, Total: 2.9 g/dL (ref 1.5–4.5)
Glucose: 128 mg/dL — ABNORMAL HIGH (ref 65–99)
Potassium: 4 mmol/L (ref 3.5–5.2)
Sodium: 139 mmol/L (ref 134–144)
Total Protein: 7.1 g/dL (ref 6.0–8.5)

## 2019-09-15 LAB — CBC WITH DIFFERENTIAL/PLATELET
Basophils Absolute: 0.1 10*3/uL (ref 0.0–0.2)
Basos: 1 %
EOS (ABSOLUTE): 0.1 10*3/uL (ref 0.0–0.4)
Eos: 2 %
Hematocrit: 41.5 % (ref 34.0–46.6)
Hemoglobin: 14 g/dL (ref 11.1–15.9)
Immature Grans (Abs): 0 10*3/uL (ref 0.0–0.1)
Immature Granulocytes: 0 %
Lymphocytes Absolute: 2.4 10*3/uL (ref 0.7–3.1)
Lymphs: 48 %
MCH: 29.4 pg (ref 26.6–33.0)
MCHC: 33.7 g/dL (ref 31.5–35.7)
MCV: 87 fL (ref 79–97)
Monocytes Absolute: 0.3 10*3/uL (ref 0.1–0.9)
Monocytes: 7 %
Neutrophils Absolute: 2.1 10*3/uL (ref 1.4–7.0)
Neutrophils: 42 %
Platelets: 301 10*3/uL (ref 150–450)
RBC: 4.76 x10E6/uL (ref 3.77–5.28)
RDW: 13.1 % (ref 11.7–15.4)
WBC: 4.9 10*3/uL (ref 3.4–10.8)

## 2019-09-15 LAB — LIPID PANEL W/O CHOL/HDL RATIO
Cholesterol, Total: 213 mg/dL — ABNORMAL HIGH (ref 100–199)
HDL: 48 mg/dL (ref >39)
LDL Chol Calc (NIH): 143 mg/dL — ABNORMAL HIGH (ref 0–99)
Triglycerides: 125 mg/dL (ref 0–149)
VLDL Cholesterol Cal: 22 mg/dL (ref 5–40)

## 2019-09-15 LAB — RHEUMATOID FACTOR: Rheumatoid fact SerPl-aCnc: <10 IU/mL (ref 0.0–13.9)

## 2019-09-15 LAB — TSH: TSH: 1.04 u[IU]/mL (ref 0.450–4.500)

## 2019-09-15 LAB — MICROALBUMIN / CREATININE URINE RATIO
Creatinine, Urine: 50.8 mg/dL
Microalb/Creat Ratio: 257 mg/g creat — ABNORMAL HIGH (ref 0–29)
Microalbumin, Urine: 130.4 ug/mL

## 2019-09-15 LAB — ANA: Anti Nuclear Antibody (ANA): NEGATIVE

## 2019-09-15 LAB — HGB A1C W/O EAG: Hgb A1c MFr Bld: 6.7 % — ABNORMAL HIGH (ref 4.8–5.6)

## 2019-09-15 LAB — SEDIMENTATION RATE: Sed Rate: 16 mm/hr (ref 0–32)

## 2019-10-12 ENCOUNTER — Ambulatory Visit: Payer: Medicaid Other | Admitting: Physical Therapy

## 2019-10-18 ENCOUNTER — Ambulatory Visit (INDEPENDENT_AMBULATORY_CARE_PROVIDER_SITE_OTHER): Payer: 59 | Admitting: Internal Medicine

## 2019-10-18 ENCOUNTER — Encounter: Payer: Self-pay | Admitting: Internal Medicine

## 2019-10-18 ENCOUNTER — Other Ambulatory Visit: Payer: Self-pay

## 2019-10-18 VITALS — BP 128/86 | HR 60 | Resp 12 | Ht 61.0 in | Wt 233.0 lb

## 2019-10-18 DIAGNOSIS — E119 Type 2 diabetes mellitus without complications: Secondary | ICD-10-CM

## 2019-10-18 DIAGNOSIS — M25562 Pain in left knee: Secondary | ICD-10-CM

## 2019-10-18 DIAGNOSIS — M25561 Pain in right knee: Secondary | ICD-10-CM

## 2019-10-18 DIAGNOSIS — M545 Low back pain, unspecified: Secondary | ICD-10-CM

## 2019-10-18 DIAGNOSIS — Z23 Encounter for immunization: Secondary | ICD-10-CM

## 2019-10-18 DIAGNOSIS — G8929 Other chronic pain: Secondary | ICD-10-CM

## 2019-10-18 NOTE — Progress Notes (Signed)
Subjective:    Patient ID: Raven Porter, female   DOB: [birth year], 44 y.o.   MRN: [id number]   HPI   1.  DM:  Not taking Metformin for almost 4 weeks as she is fasting during Ramadan.  She does eat around 8 pm and can eat around 4:30 a.m., but does not.  She has not been taking her Metformin.  Despite not taking, A1C recently was 6.7%. Minimal microalbuminuria. Has never had an eye check.   States she is planning to move back to [place] as she had better coverage for medical care.  2.  Low Back Pain/bilateral knee pain:  Has PT appt on May 15th in person.  She put this off due to Ramadan.  She feels both areas of pain are not improved with Diclofenac and Cyclobenzaprine.  She does sleep well with Cyclobenzaprine, 1/2 tab.  When she wakes in the morning, she feels better.  As the day goes on, her pain returns.  She admits during Ramadan, she has not been taking the Diclofenac during the day.  Does not eat again at 4:30a.m. so not taking the medication.  3.  [facility]:  Tried to call previously, but no answer.  She did not leave a message and decided to wait until after Ramadan is done.   Made phone calls during her visit to [facility] and [facility] ([facility]) and could only leave messages regarding English reading comprehension and a stationary bike for the latter services as well.  Current Meds  Medication Sig  . cyclobenzaprine (FLEXERIL) 10 MG tablet 1/2 to 1 tab by mouth daily at bedtime for low back pain  . diclofenac (VOLTAREN) 75 MG EC tablet 1 tab by mouth once to twice daily with food as needed for pain  . famotidine (PEPCID) 20 MG tablet 2 tabs by mouth at bedtime   No Known Allergies   Review of Systems    Objective:   BP 128/86 (BP Location: Left Arm, Patient Position: Sitting, Cuff Size: Large)   Pulse 60   Resp 12   Ht 5\' 1"  (1.549 m)   Wt 233 lb (105.7 kg)   LMP 10/03/2019 (Approximate)   BMI 44.02 kg/m   Physical  Exam  NAD HEENT:  PERRL EOMI Neck:  Supple, No adenopathy Chest:  CTA CV:  RRR with SEM unchanged, no rub, radial and DP pulses normal and equal MS:  Tender over paraspinous musculature of L/S spine.   Diabetic foot exam was performed with the following findings:   No deformities, ulcerations, or other skin breakdown Normal sensation of 10g monofilament Intact posterior tibialis and dorsalis pedis pulses       Assessment & Plan  1.  DM:  Discussed in the future with Ramadan, she should be taking her medication with meals in the nighttime as she would during daytime meals.   Recommended [facility] for eye exam.  2.  Low back pain/OA of knees:  Upcoming PT visit.  Encouraged use of Cyclobenzaprine for sleep.  Again, with Ramadan, can take Diclofenac with meals during night hours.  Sounds like it has actually worked for her, she just stopped taking due to no longer taking meals during day. Call into [employer] for reading, possibly of relevant languages and for stationary bike.  3.  Unable to read:  Call again into [facility] for English reading in particular.  Not sure we can get this started with her leaving the area soon.  4.  HM  Tdap and pneumococcal.  May be moving back to [place] once school is out as unable to get Medicaid coverage in [place] and family closer.  Will schedule her for July and see if can check cholesterol and A1C with her caring adequately for her DM one more time

## 2019-11-17 ENCOUNTER — Other Ambulatory Visit (HOSPITAL_COMMUNITY): Payer: Self-pay | Admitting: Obstetrics & Gynecology

## 2019-11-17 ENCOUNTER — Ambulatory Visit (HOSPITAL_COMMUNITY)
Admission: RE | Admit: 2019-11-17 | Discharge: 2019-11-17 | Disposition: A | Discharge status: Home / Self Care | Payer: 59 | Source: Ambulatory Visit | Attending: Obstetrics & Gynecology | Admitting: Obstetrics & Gynecology

## 2019-11-17 ENCOUNTER — Other Ambulatory Visit: Payer: Self-pay

## 2019-11-17 ENCOUNTER — Ambulatory Visit (HOSPITAL_COMMUNITY): Payer: 59

## 2019-11-17 DIAGNOSIS — M545 Low back pain, unspecified: Secondary | ICD-10-CM

## 2019-11-17 DIAGNOSIS — R102 Pelvic and perineal pain: Secondary | ICD-10-CM

## 2019-11-23 ENCOUNTER — Telehealth: Payer: Self-pay | Admitting: *Deleted

## 2019-11-23 NOTE — Telephone Encounter (Addendum)
-----   Message from Rada Hay sent at 11/23/2019  3:27 PM EDT ----- Please also give patient f/u appointment for management  ----- Message ----- From: Osborne Oman, MD Sent: 11/21/2019   8:12 AM EDT To: [facility] Admin Pool  Patient has multiple fibroids notes, biggest one 5.4 cm.  This can cause some pain. Please call to inform patient of results.  She can come in to discuss management, an appointment can be scheduled for her.  [place] pt and informed her of [place] results as well as message from Dr. Harolyn Rutherford. I offered follow up appointment on 8/4 @ 3:15pm. She states that she would like office appointment to discuss management however she will be moving to [place] on 8/1. I advised that I will see if her appointment can be changed. Pt agreed to see a different female provider if Dr. Harolyn Rutherford is not available prior to 8/1. Pt voiced understanding of all information given.

## 2019-12-29 ENCOUNTER — Ambulatory Visit: Payer: 59 | Admitting: Obstetrics and Gynecology

## 2019-12-29 ENCOUNTER — Other Ambulatory Visit (INDEPENDENT_AMBULATORY_CARE_PROVIDER_SITE_OTHER): Payer: Self-pay

## 2019-12-29 DIAGNOSIS — E119 Type 2 diabetes mellitus without complications: Secondary | ICD-10-CM

## 2019-12-29 DIAGNOSIS — Z13220 Encounter for screening for lipoid disorders: Secondary | ICD-10-CM

## 2019-12-30 LAB — LIPID PANEL W/O CHOL/HDL RATIO
Cholesterol, Total: 208 mg/dL — ABNORMAL HIGH (ref 100–199)
HDL: 48 mg/dL (ref >39)
LDL Chol Calc (NIH): 146 mg/dL — ABNORMAL HIGH (ref 0–99)
Triglycerides: 80 mg/dL (ref 0–149)
VLDL Cholesterol Cal: 14 mg/dL (ref 5–40)

## 2019-12-30 LAB — HGB A1C W/O EAG: Hgb A1c MFr Bld: 6 % — ABNORMAL HIGH (ref 4.8–5.6)

## 2020-01-04 ENCOUNTER — Ambulatory Visit: Payer: 59 | Admitting: Internal Medicine

## 2020-01-11 ENCOUNTER — Ambulatory Visit: Payer: 59 | Admitting: Obstetrics & Gynecology

## 2020-01-20 ENCOUNTER — Telehealth: Payer: Self-pay | Admitting: Internal Medicine

## 2020-01-20 NOTE — Telephone Encounter (Signed)
Patient moved to [place]

## 2020-01-23 ENCOUNTER — Ambulatory Visit: Payer: 59 | Admitting: Internal Medicine

## 2020-02-15 ENCOUNTER — Other Ambulatory Visit: Payer: Self-pay | Admitting: Internal Medicine

## 2020-02-15 MED ORDER — SIMVASTATIN 40 MG PO TABS
ORAL_TABLET | ORAL | 11 refills | Status: AC
Start: 2020-02-15 — End: ?
